# Patient Record
Sex: Male | Born: 1955 | Race: White | Hispanic: No | Marital: Married
Health system: Southern US, Community
[De-identification: ages and names within clinical notes are randomized; demographics above are authoritative.]

---

## 2021-04-09 ENCOUNTER — Other Ambulatory Visit: Payer: Self-pay

## 2021-04-09 ENCOUNTER — Emergency Department (HOSPITAL_COMMUNITY)
Admission: EM | Admit: 2021-04-09 | Discharge: 2021-04-09 | Disposition: A | Discharge status: Home / Self Care | Payer: Self-pay | Attending: Emergency Medicine | Admitting: Emergency Medicine

## 2021-04-09 DIAGNOSIS — Z5321 Procedure and treatment not carried out due to patient leaving prior to being seen by health care provider: Secondary | ICD-10-CM | POA: Insufficient documentation

## 2021-04-09 DIAGNOSIS — H538 Other visual disturbances: Secondary | ICD-10-CM | POA: Insufficient documentation

## 2021-04-09 NOTE — ED Triage Notes (Signed)
Pt sent by his eye doctor for further evaluation of visual changes and new onset hypertension. Pt stating that he only wants BP medication and no other tests to be done.

## 2021-04-09 NOTE — ED Provider Notes (Signed)
Emergency Medicine Provider Triage Evaluation Note  Clinton Brooks , a 65 y.o. male  was evaluated in triage.  Pt complains of vision changes. He was seen by his ophthlamologist pta and BP was significantly elevated. 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 According to paperwork that pt has at bedside, ophtho was recommended labs and mri brain and orbits. I explained this to pt and he iterated that he was simply here for a prescription for blood pressure medications. He states he cannot afford a w/u. I encouraged him to stay and he is going to call the hospital to estimate the cost of the testing and will return for the remainder if triage if he decides to stay. I advised that if he wants to leave the department his medical condition could deteriorate and lead to permanent disability or death. He understands this risk .  Review of Systems  Positive: N/a Negative: N/a  Physical Exam  BP (!) 180/157 (BP Location: Right Arm)   Pulse 97   Temp 98.7 F (37.1 C)   Resp 17   SpO2 98%  Gen:   Awake, no distress   Resp:  Normal effort  MSK:   Moves extremities without difficulty   Medical Decision Making   Pt ultimately decided to leave AMA. He understands the risks associated with leaving the department   Clinton Brooks 04/09/21 1802    Mancel Bale, MD 04/11/21 1148

## 2021-04-10 ENCOUNTER — Other Ambulatory Visit: Payer: Self-pay

## 2021-04-10 ENCOUNTER — Emergency Department
Admission: EM | Admit: 2021-04-10 | Discharge: 2021-04-10 | Disposition: A | Discharge status: Home / Self Care | Payer: Self-pay | Attending: Emergency Medicine | Admitting: Emergency Medicine

## 2021-04-10 ENCOUNTER — Emergency Department: Payer: Self-pay

## 2021-04-10 DIAGNOSIS — Z79899 Other long term (current) drug therapy: Secondary | ICD-10-CM | POA: Insufficient documentation

## 2021-04-10 DIAGNOSIS — I10 Essential (primary) hypertension: Secondary | ICD-10-CM

## 2021-04-10 DIAGNOSIS — G932 Benign intracranial hypertension: Secondary | ICD-10-CM

## 2021-04-10 DIAGNOSIS — R700 Elevated erythrocyte sedimentation rate: Secondary | ICD-10-CM | POA: Insufficient documentation

## 2021-04-10 LAB — CBC
HCT: 45.9 % (ref 39.0–52.0)
Hemoglobin: 15.3 g/dL (ref 13.0–17.0)
MCH: 29.8 pg (ref 26.0–34.0)
MCHC: 33.3 g/dL (ref 30.0–36.0)
MCV: 89.5 fL (ref 80.0–100.0)
Platelets: 277 10*3/uL (ref 150–400)
RBC: 5.13 MIL/uL (ref 4.22–5.81)
RDW: 13.7 % (ref 11.5–15.5)
WBC: 7 10*3/uL (ref 4.0–10.5)
nRBC: 0 % (ref 0.0–0.2)

## 2021-04-10 LAB — BASIC METABOLIC PANEL
Anion gap: 8 (ref 5–15)
BUN: 16 mg/dL (ref 8–23)
CO2: 24 mmol/L (ref 22–32)
Calcium: 8.9 mg/dL (ref 8.9–10.3)
Chloride: 103 mmol/L (ref 98–111)
Creatinine, Ser: 0.93 mg/dL (ref 0.61–1.24)
GFR, Estimated: >60 mL/min (ref >60)
Glucose, Bld: 131 mg/dL — ABNORMAL HIGH (ref 70–99)
Potassium: 3.5 mmol/L (ref 3.5–5.1)
Sodium: 135 mmol/L (ref 135–145)

## 2021-04-10 LAB — TROPONIN I (HIGH SENSITIVITY): Troponin I (High Sensitivity): 8 ng/L (ref <18)

## 2021-04-10 LAB — C-REACTIVE PROTEIN: CRP: 0.6 mg/dL (ref <1.0)

## 2021-04-10 LAB — SEDIMENTATION RATE: Sed Rate: 24 mm/hr — ABNORMAL HIGH (ref 0–20)

## 2021-04-10 IMAGING — MR MR HEAD WO/W CM
13 series · 48 of 48 positions shown · IV contrast (gadavist)
Comparison: None.

CLINICAL DATA: Brain mass or lesion; Vision loss, monocular

EXAM:
MRI HEAD AND ORBITS WITHOUT AND WITH CONTRAST
TECHNIQUE: Multiplanar, multiecho pulse sequences of the brain and surrounding
structures were obtained without and with intravenous contrast.
Multiplanar, multiecho pulse sequences of the orbits and surrounding
structures were obtained including fat saturation techniques, before
and after intravenous contrast administration.
CONTRAST:  10mL GADAVIST GADOBUTROL 1 MMOL/ML IV SOLN

[Series 5: ax dwi_tracew · axial · 3.0mm · 1.80mm/px · z∈[-87,+81]mm · 4 of 52 slices shown]
[im 1/52]
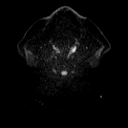
[im 18/52]
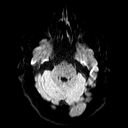
[im 35/52]
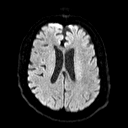
[im 52/52]
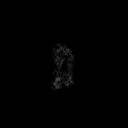

[Series 6: ax dwi_adc · axial · 3.0mm · 1.80mm/px · z∈[-87,+81]mm · 4 of 52 slices shown]
[im 1/52]
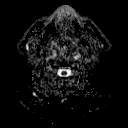
[im 18/52]
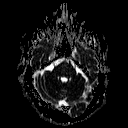
[im 35/52]
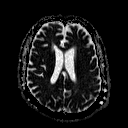
[im 52/52]
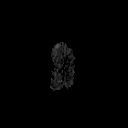

[Series 7: cor dwi_tracew · coronal · 5.0mm · 1.80mm/px · 2 of 42 slices shown]
[im 1/42]
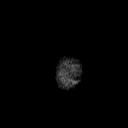
[im 42/42]
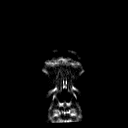

[Series 8: cor dwi_adc · coronal · 5.0mm · 1.80mm/px · 2 of 42 slices shown]
[im 1/42]
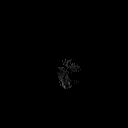
[im 42/42]
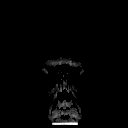

[Series 9: T1 · sagittal · 5.0mm · 0.62mm/px · 1 of 25 slices shown (1 of 2)]
[im 1/25]
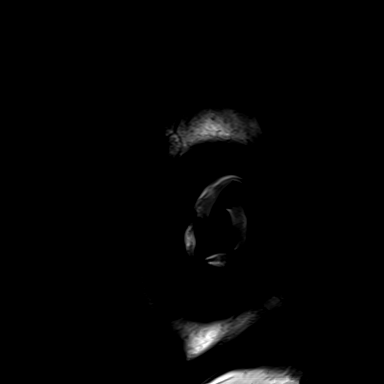

[Series 10: T2 · axial · 5.0mm · 0.53mm/px · z∈[-75,+80]mm · 2 of 27 slices shown (1 of 2)]
[im 1/27]
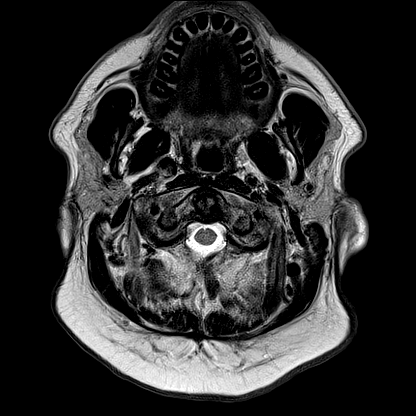
[im 27/27]
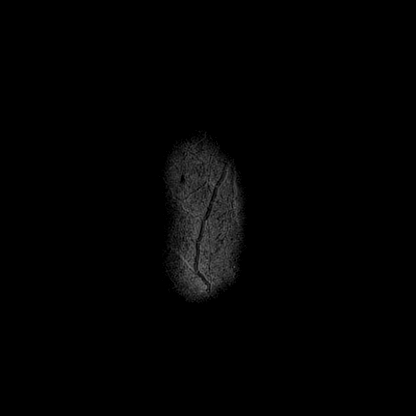

[Series 12: pha_images · axial · 3.0mm · 0.90mm/px · z∈[-80,+82]mm · 3 of 55 slices shown]
[im 1/55]
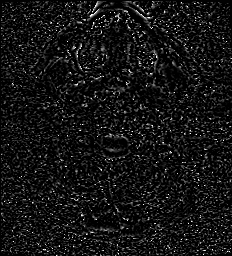
[im 28/55]
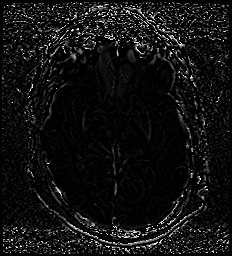
[im 55/55]
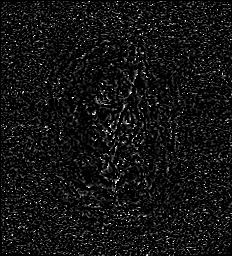

[Series 13: swi_images · axial · 3.0mm · 0.90mm/px · z∈[-80,+85]mm · 3 of 56 slices shown]
[im 1/56]
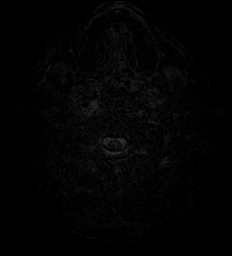
[im 28/56]
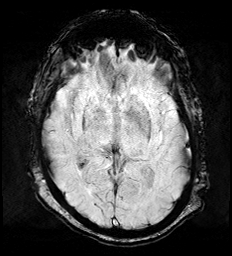
[im 56/56]
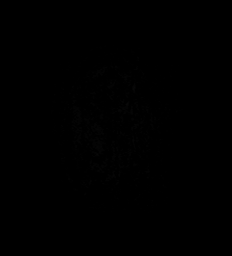

[Series 15: FLAIR · axial · 3.0mm · 0.69mm/px · z∈[-78,+83]mm · 3 of 55 slices shown]
[im 1/55]
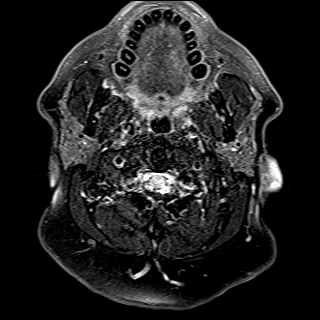
[im 28/55]
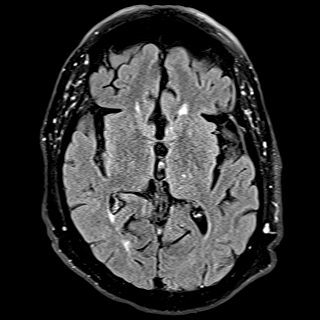
[im 55/55]
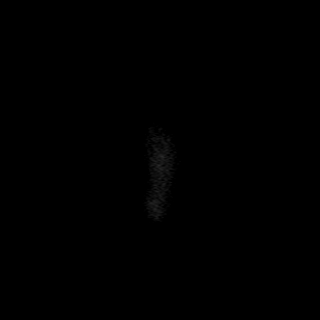

[Series 16: T1 · axial · 1.0mm · 0.98mm/px · z∈[-83,+90]mm · 10 of 175 slices shown (2 of 2)]
[im 1/175]
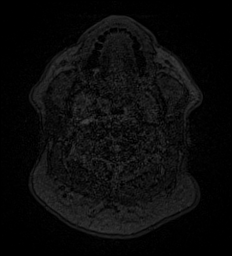
[im 20/175]
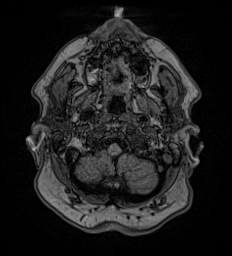
[im 39/175]
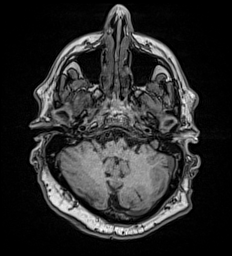
[im 59/175]
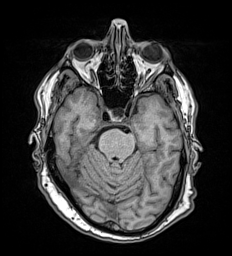
[im 78/175]
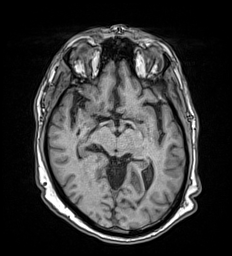
[im 97/175]
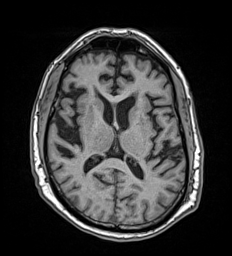
[im 117/175]
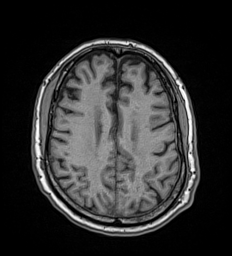
[im 136/175]
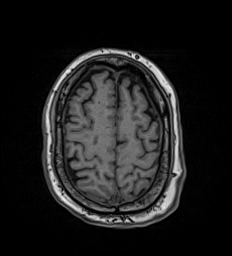
[im 155/175]
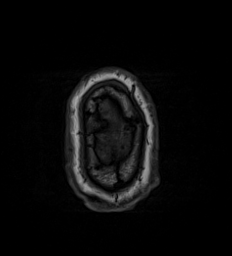
[im 175/175]
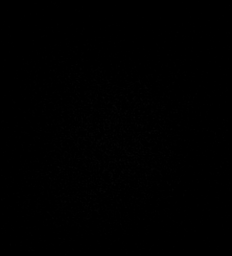

[Series 17: T2 · coronal · 5.0mm · 0.69mm/px · 2 of 33 slices shown (2 of 2)]
[im 1/33]
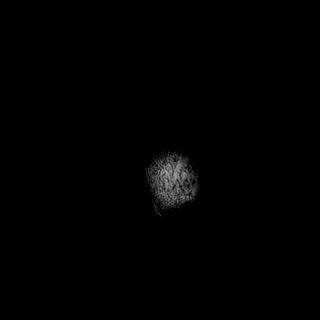
[im 33/33]
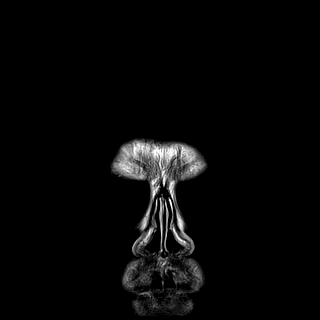

[Series 18: T1 post-contrast · axial · 1.0mm · 0.98mm/px · z∈[-83,+91]mm · 10 of 176 slices shown (1 of 2)]
[im 1/176]
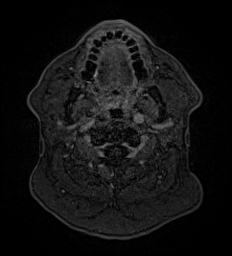
[im 20/176]
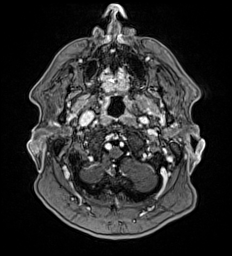
[im 39/176]
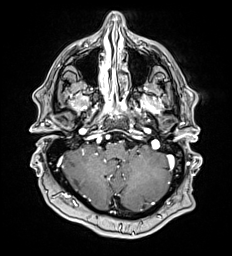
[im 59/176]
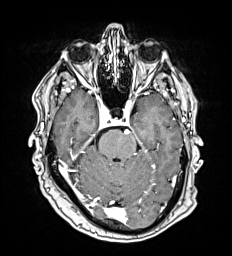
[im 78/176]
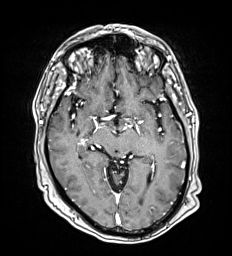
[im 98/176]
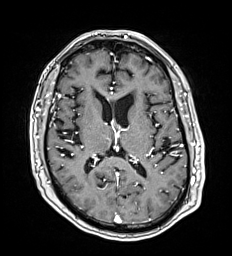
[im 117/176]
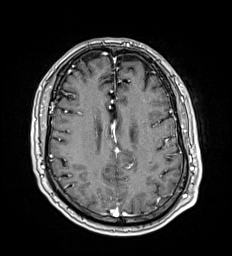
[im 137/176]
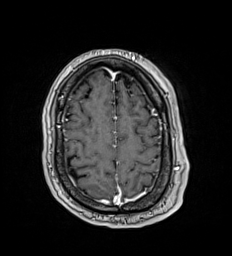
[im 156/176]
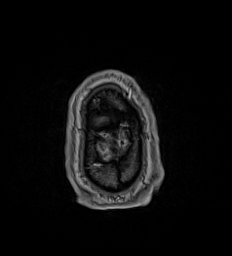
[im 176/176]
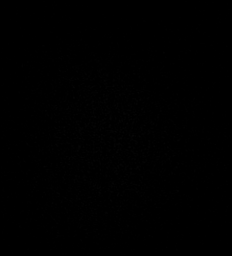

[Series 19: T1 post-contrast · coronal · 5.0mm · 0.57mm/px · 2 of 33 slices shown (2 of 2)]
[im 1/33]
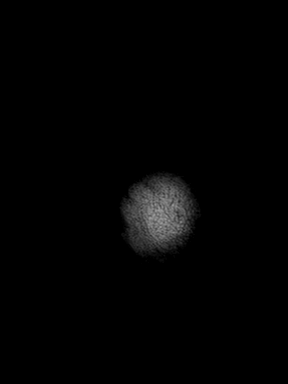
[im 33/33]
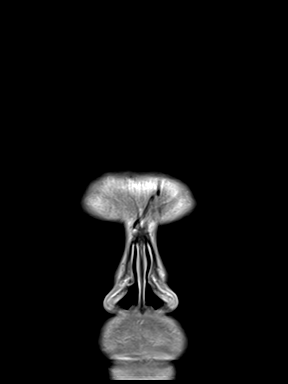

[48 of 48 positions shown; findings below may reference images not displayed]

FINDINGS: MRI HEAD FINDINGS

Brain: No acute infarction, hemorrhage, hydrocephalus, extra-axial
collection or mass lesion. Partially empty sella. Mild scattered T2
hyperintensities in the white matter, nonspecific but compatible
with chronic microvascular ischemic disease. Mildly prominent retro
cerebellar CSF, likely mega cisterna magna versus small arachnoid
cyst. No abnormal enhancement. Mild atrophy with ex vacuo
ventricular dilation

Vascular: Major arterial flow voids are maintained at the skull
base. Narrowing of the right transverse and sigmoid sinuses.

Skull and upper cervical spine: Normal marrow signal.

Other: Trace right mastoid effusion.

MRI ORBITS FINDINGS

Orbits: No traumatic or inflammatory finding. Globes, optic nerves,
orbital fat, extraocular muscles, vascular structures, and lacrimal
glands are normal. No appreciable flattening of the posterior sclera
or protrusion of the optic nerve papilla.

Visualized sinuses: Mild ethmoid air cell mucosal thickening.
Otherwise, clear.

Soft tissues: Negative.
IMPRESSION: MRI head:

1. No evidence of acute intracranial abnormality.
2. Mild chronic microvascular ischemic disease and atrophy.
3. Partially empty sella and narrowed right transverse sinus, which
are often normal anatomic variants but can be associated with
idiopathic intracranial hypertension.

MRI orbits:

No evidence of acute abnormality.  No orbital mass.

## 2021-04-10 IMAGING — MR MR ORBITS WO/W CM
4 of 6 series · 29 of 48 positions shown · IV contrast (10ml Gadavist)
Comparison: None.

CLINICAL DATA: Brain mass or lesion; Vision loss, monocular

EXAM:
MRI HEAD AND ORBITS WITHOUT AND WITH CONTRAST
TECHNIQUE: Multiplanar, multiecho pulse sequences of the brain and surrounding
structures were obtained without and with intravenous contrast.
Multiplanar, multiecho pulse sequences of the orbits and surrounding
structures were obtained including fat saturation techniques, before
and after intravenous contrast administration.
CONTRAST:  10mL GADAVIST GADOBUTROL 1 MMOL/ML IV SOLN

[Series 2: T2 · axial · 3.0mm · 0.78mm/px · z∈[-54,+10]mm · 5 of 17 slices shown (1 of 2)]
[im 1/17]
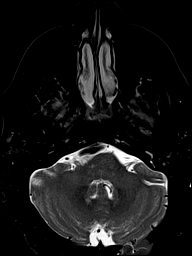
[im 5/17]
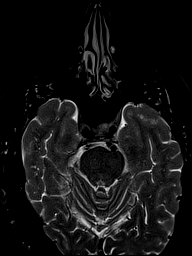
[im 9/17]
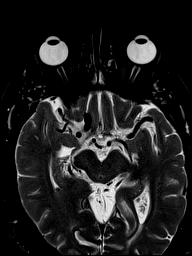
[im 13/17]
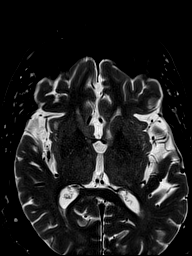
[im 17/17]
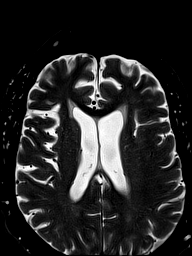

[Series 3: T1 · axial · non-contrast · 3.0mm · 0.31mm/px · z∈[-57,+9]mm · 6 of 23 slices shown (1 of 2)]
[im 1/23]
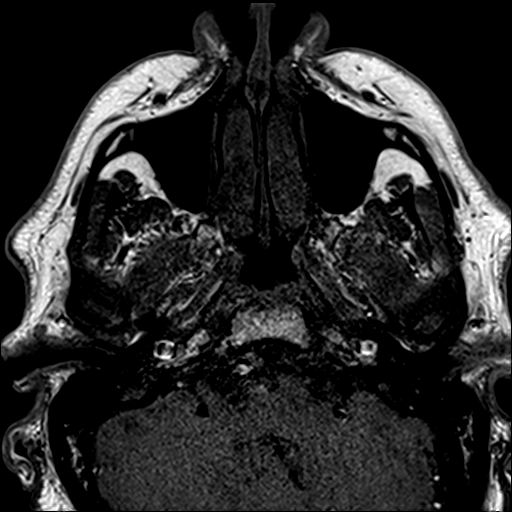
[im 5/23]
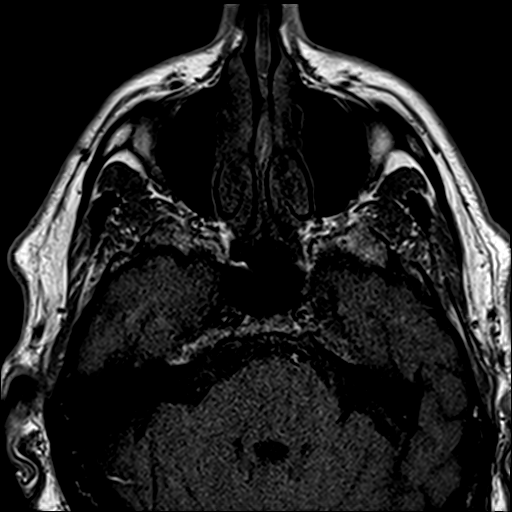
[im 9/23]
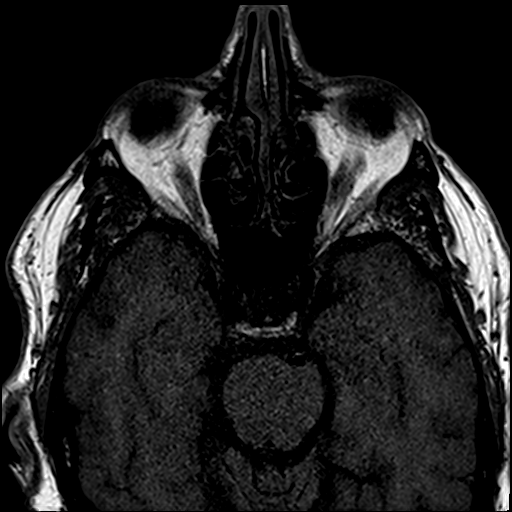
[im 14/23]
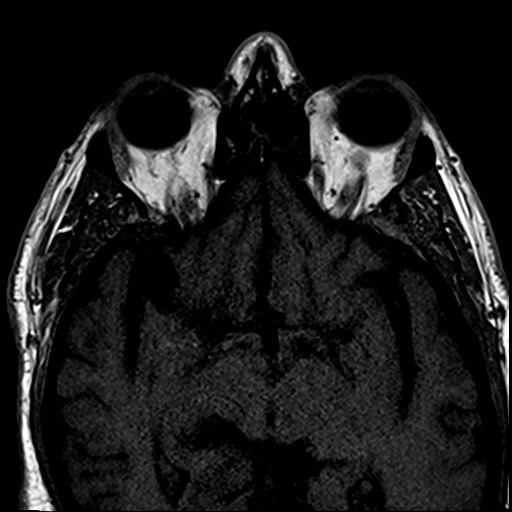
[im 18/23]
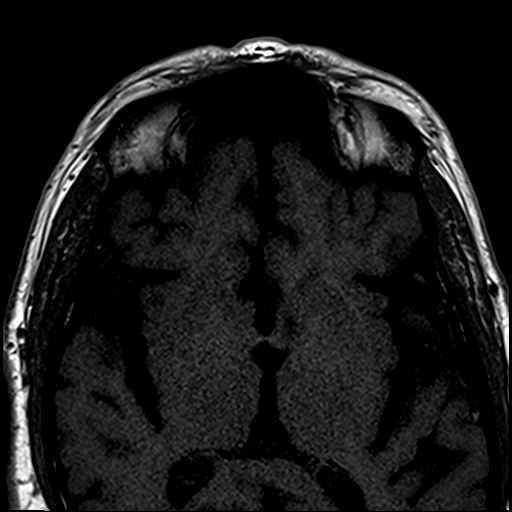
[im 23/23]
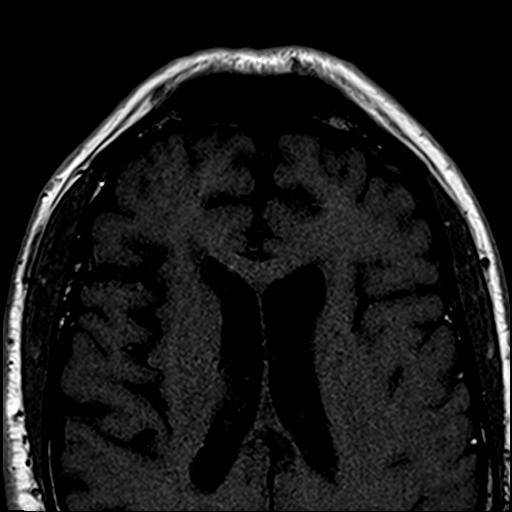

[Series 5: T2 · coronal · 3.0mm · 0.78mm/px · 9 of 35 slices shown (2 of 2)]
[im 1/35]
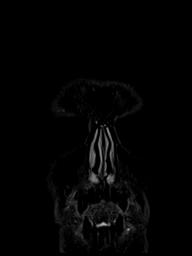
[im 5/35]
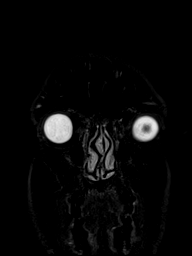
[im 9/35]
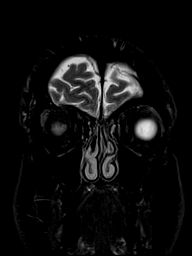
[im 13/35]
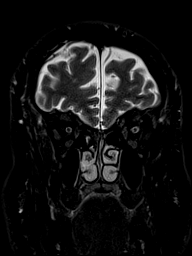
[im 18/35]
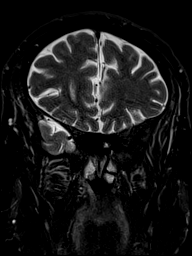
[im 22/35]
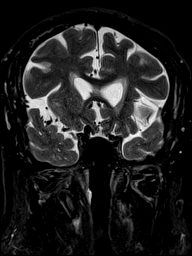
[im 26/35]
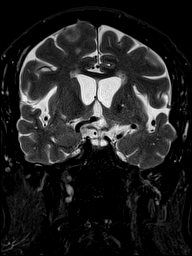
[im 30/35]
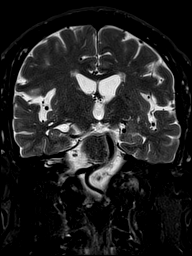
[im 35/35]
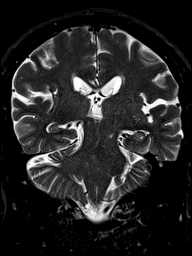

[Series 7: T1 · coronal · non-contrast · 3.0mm · 0.35mm/px · 9 of 40 slices shown (2 of 2)]
[im 1/40]
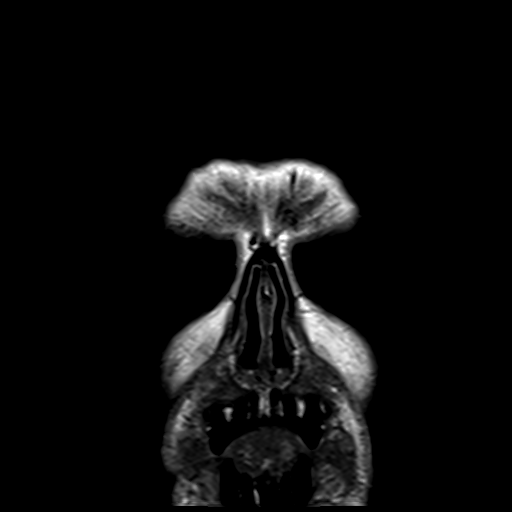
[im 4/40]
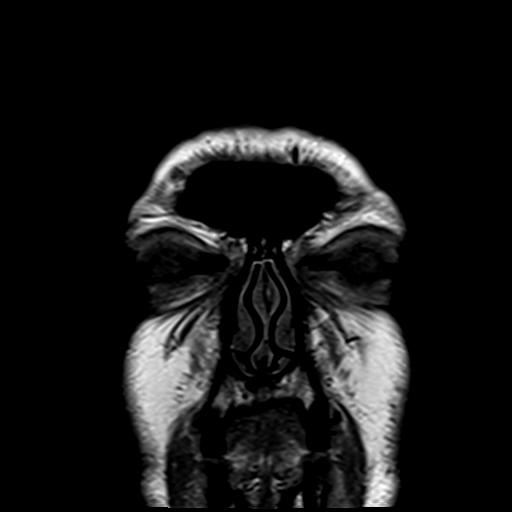
[im 8/40]
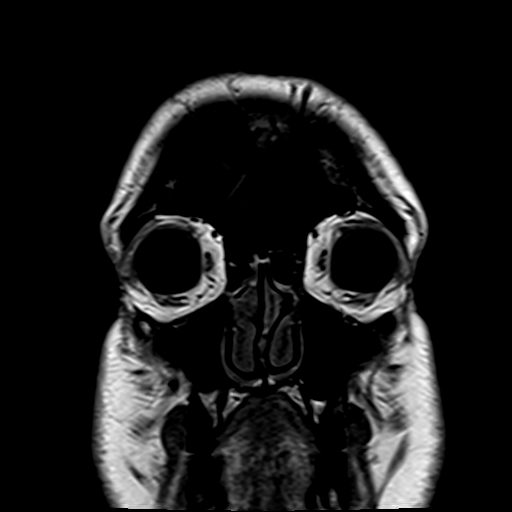
[im 12/40]
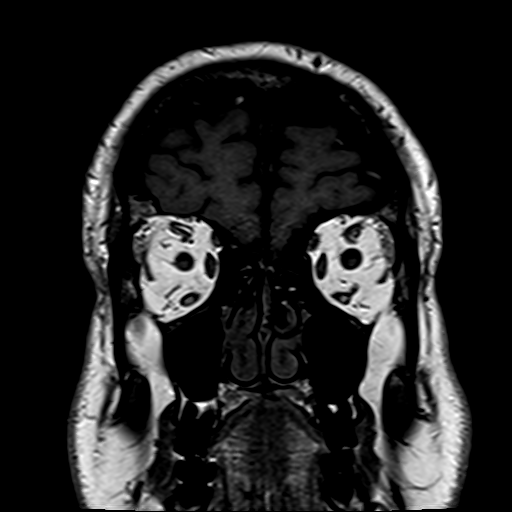
[im 16/40]
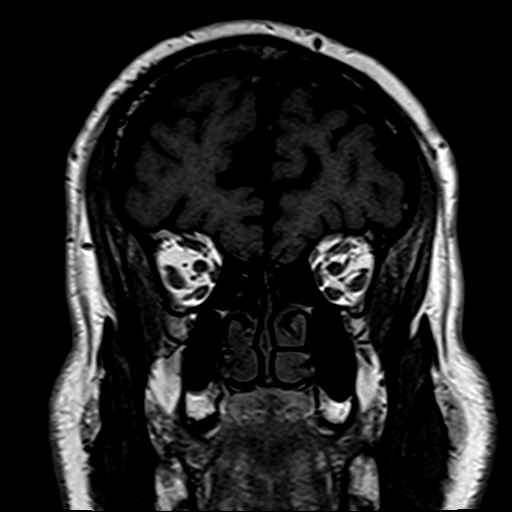
[im 20/40]
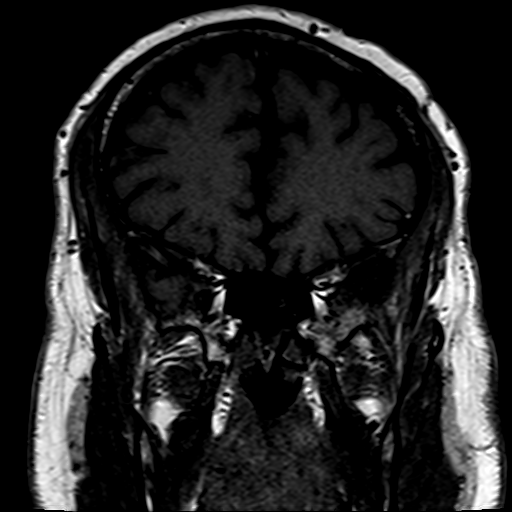
[im 24/40]
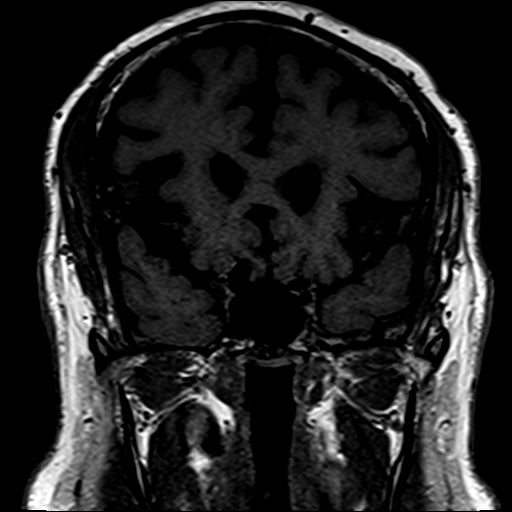
[im 28/40]
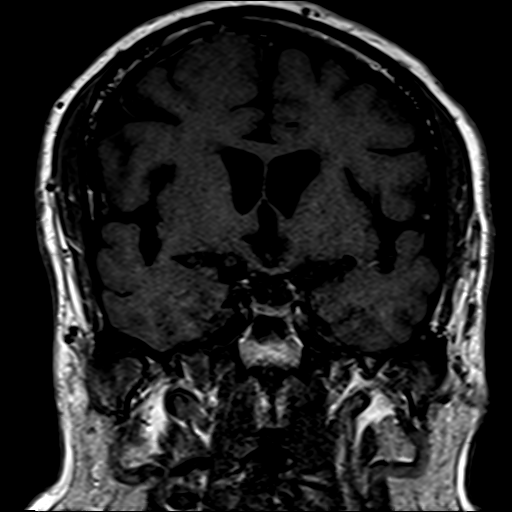
[im 36/40]
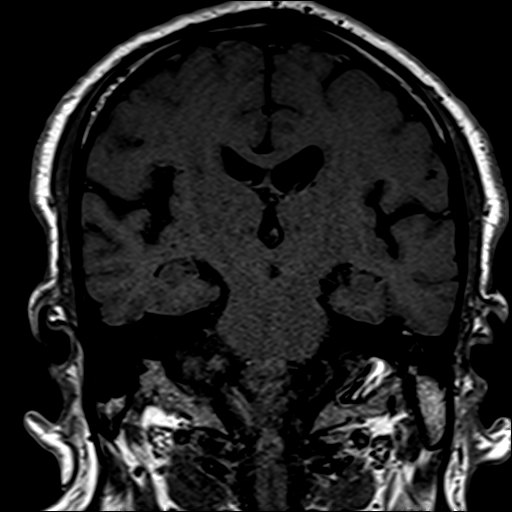

[29 of 48 positions shown; findings below may reference images not displayed]

FINDINGS: MRI HEAD FINDINGS

Brain: No acute infarction, hemorrhage, hydrocephalus, extra-axial
collection or mass lesion. Partially empty sella. Mild scattered T2
hyperintensities in the white matter, nonspecific but compatible
with chronic microvascular ischemic disease. Mildly prominent retro
cerebellar CSF, likely mega cisterna magna versus small arachnoid
cyst. No abnormal enhancement. Mild atrophy with ex vacuo
ventricular dilation

Vascular: Major arterial flow voids are maintained at the skull
base. Narrowing of the right transverse and sigmoid sinuses.

Skull and upper cervical spine: Normal marrow signal.

Other: Trace right mastoid effusion.

MRI ORBITS FINDINGS

Orbits: No traumatic or inflammatory finding. Globes, optic nerves,
orbital fat, extraocular muscles, vascular structures, and lacrimal
glands are normal. No appreciable flattening of the posterior sclera
or protrusion of the optic nerve papilla.

Visualized sinuses: Mild ethmoid air cell mucosal thickening.
Otherwise, clear.

Soft tissues: Negative.
IMPRESSION: MRI head:

1. No evidence of acute intracranial abnormality.
2. Mild chronic microvascular ischemic disease and atrophy.
3. Partially empty sella and narrowed right transverse sinus, which
are often normal anatomic variants but can be associated with
idiopathic intracranial hypertension.

MRI orbits:

No evidence of acute abnormality.  No orbital mass.

## 2021-04-10 MED ORDER — AMLODIPINE BESYLATE 5 MG PO TABS: 5.0000 mg | ORAL_TABLET | Freq: Every day | ORAL | 0 refills | Status: DC

## 2021-04-10 MED ORDER — LABETALOL HCL 5 MG/ML IV SOLN
10.0000 mg | Freq: Once | INTRAVENOUS | Status: AC
  Administered 2021-04-10: 10 mg via INTRAVENOUS
  Filled 2021-04-10: qty 4

## 2021-04-10 MED ORDER — GADOBUTROL 1 MMOL/ML IV SOLN
10.0000 mL | Freq: Once | INTRAVENOUS | Status: AC | PRN
  Administered 2021-04-10: 10 mL via INTRAVENOUS
  Filled 2021-04-10: qty 10

## 2021-04-10 MED ORDER — AMLODIPINE BESYLATE 5 MG PO TABS
5.0000 mg | ORAL_TABLET | Freq: Once | ORAL | Status: AC
  Administered 2021-04-10: 5 mg via ORAL
  Filled 2021-04-10: qty 1

## 2021-04-10 NOTE — ED Triage Notes (Signed)
Pt here with hypertension since yesterday. Pt wemt to [facility] ED to be evaluated and was told that he needed an MRI. Pt was concerned about his bp and wanted to get that down before the MRI, to which the provider did not focus on, according to the pt so he left the facility. BP yesterday was 230/140.

## 2021-04-10 NOTE — ED Provider Notes (Signed)
[facility] Emergency Department Provider Note  ____________________________________________   Event Date/Time   First MD Initiated Contact with Patient 04/10/21 1034     (approximate)  I have reviewed the triage vital signs and the nursing notes.   HISTORY  Chief Complaint Hypertension    HPI Clinton Brooks is a 65 y.o. male here with vision changes.  The patient states that for about the last week, he has had a area of diminished vision in his right visual field.  This is been fairly constant.  He went to optometry yesterday and was sent to a retina specialist.  He was sent to the ED after exam showed papilledema and MRI was ordered.  He is also severely hypertensive at that time.  Denies history of hypertension and states this is fairly new in the last month.  He states that while waiting, he was not given anything for blood pressure which he thought was not reasonable, so he left without being seen.  He did not understand the need for MRI at that time and was also hesitant about additional expenditures.  He does not have insurance.  He states that his vision has remained fairly constant since the onset of diminished vision about a week ago.  He does drive for work.  He has not noticed any severe impairment of his driving since the onset of his vision changes.  No double vision.  No focal numbness or weakness.  No other medical complaints.  Denies history of CVA.    No past medical history on file.  There are no problems to display for this patient.     Prior to Admission medications   Medication Sig Start Date End Date Taking? Authorizing Provider  amLODipine (NORVASC) 5 MG tablet Take 1 tablet (5 mg total) by mouth daily. 04/10/21 06/09/21 Yes Duffy Bruce, MD    Allergies Patient has no known allergies.  No family history on file.  Social History    Review of Systems  Review of Systems  Constitutional:  Negative for chills and fever.   HENT:  Negative for sore throat.   Eyes:  Positive for visual disturbance.  Respiratory:  Negative for shortness of breath.   Cardiovascular:  Negative for chest pain.  Gastrointestinal:  Negative for abdominal pain.  Genitourinary:  Negative for flank pain.  Musculoskeletal:  Negative for neck pain.  Skin:  Negative for rash and wound.  Allergic/Immunologic: Negative for immunocompromised state.  Neurological:  Negative for weakness and numbness.  Hematological:  Does not bruise/bleed easily.  All other systems reviewed and are negative.   ____________________________________________  PHYSICAL EXAM:      VITAL SIGNS: ED Triage Vitals  Enc Vitals Group     BP 04/10/21 0958 (!) 213/119     Pulse Rate 04/10/21 0958 72     Resp 04/10/21 0958 18     Temp 04/10/21 0958 98.5 F (36.9 C)     Temp Source 04/10/21 0958 Oral     SpO2 04/10/21 0958 95 %     Weight 04/10/21 0959 221 lb 4.8 oz (100.4 kg)     Height 04/10/21 0959 5' 7"  (1.702 m)     Head Circumference --      Peak Flow --      Pain Score 04/10/21 0959 0     Pain Loc --      Pain Edu? --      Excl. in [place]? --      Physical  Exam Vitals and nursing note reviewed.  Constitutional:      General: He is not in acute distress.    Appearance: He is well-developed.  HENT:     Head: Normocephalic and atraumatic.  Eyes:     Conjunctiva/sclera: Conjunctivae normal.  Cardiovascular:     Rate and Rhythm: Normal rate and regular rhythm.     Heart sounds: Normal heart sounds.  Pulmonary:     Effort: Pulmonary effort is normal. No respiratory distress.     Breath sounds: No wheezing.  Abdominal:     General: There is no distension.  Musculoskeletal:     Cervical back: Neck supple.  Skin:    General: Skin is warm.     Capillary Refill: Capillary refill takes less than 2 seconds.     Findings: No rash.  Neurological:     Mental Status: He is alert and oriented to person, place, and time.     Motor: No abnormal muscle  tone.     Comments: Neurological Exam:  Mental Status: Alert and oriented to person, place, and time. Attention and concentration normal. Speech clear. Recent memory is intact. Cranial Nerves: Visual fields grossly intact. EOMI and PERRLA. No nystagmus noted. Facial sensation intact at forehead, maxillary cheek, and chin/mandible bilaterally. No facial asymmetry or weakness. Hearing grossly normal. Uvula is midline, and palate elevates symmetrically. Normal SCM and trapezius strength. Tongue midline without fasciculations. Motor: Muscle strength 5/5 in proximal and distal UE and LE bilaterally. No pronator drift. Muscle tone normal. Sensation: Intact to light touch in upper and lower extremities distally bilaterally.  Coordination: Normal FTN bilaterally.         ____________________________________________   LABS (all labs ordered are listed, but only abnormal results are displayed)  Labs Reviewed  BASIC METABOLIC PANEL - Abnormal; Notable for the following components:      Result Value   Glucose, Bld 131 (*)    All other components within normal limits  SEDIMENTATION RATE - Abnormal; Notable for the following components:   Sed Rate 24 (*)    All other components within normal limits  CBC  C-REACTIVE PROTEIN  TROPONIN I (HIGH SENSITIVITY)    ____________________________________________  EKG:  ________________________________________  RADIOLOGY All imaging, including plain films, CT scans, and ultrasounds, independently reviewed by me, and interpretations confirmed via formal radiology reads.  ED MD interpretation:   MR Brain:   Official radiology report(s): MR Brain W and Wo Contrast  Result Date: 04/10/2021 CLINICAL DATA:  Brain mass or lesion; Vision loss, monocular EXAM: MRI HEAD AND ORBITS WITHOUT AND WITH CONTRAST TECHNIQUE: Multiplanar, multiecho pulse sequences of the brain and surrounding structures were obtained without and with intravenous contrast.  Multiplanar, multiecho pulse sequences of the orbits and surrounding structures were obtained including fat saturation techniques, before and after intravenous contrast administration. CONTRAST:  40m GADAVIST GADOBUTROL 1 MMOL/ML IV SOLN COMPARISON:  None. FINDINGS: MRI HEAD FINDINGS Brain: No acute infarction, hemorrhage, hydrocephalus, extra-axial collection or mass lesion. Partially empty sella. Mild scattered T2 hyperintensities in the white matter, nonspecific but compatible with chronic microvascular ischemic disease. 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 No abnormal enhancement. Mild atrophy with ex vacuo ventricular dilation Vascular: Major arterial flow voids are maintained at the skull base. Narrowing of the right transverse and sigmoid sinuses. Skull and upper cervical spine: Normal marrow signal. Other: Trace right mastoid effusion. MRI ORBITS FINDINGS Orbits: No traumatic or inflammatory finding. Globes, optic nerves, orbital fat,  extraocular muscles, vascular structures, and lacrimal glands are normal. No appreciable flattening of the posterior sclera or protrusion of the optic nerve papilla. Visualized sinuses: Mild ethmoid air cell mucosal thickening. Otherwise, clear. Soft tissues: Negative. IMPRESSION: MRI head: 1. No evidence of acute intracranial abnormality. 2. 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 3. Partially empty sella and narrowed right transverse sinus, which are often normal anatomic variants but can be associated with idiopathic intracranial hypertension. MRI orbits: No evidence of acute abnormality.  No orbital mass. Electronically Signed   By: Margaretha Sheffield M.D.   On: 04/10/2021 16:40   MR ORBITS W WO CONTRAST  Result Date: 04/10/2021 CLINICAL DATA:  Brain mass or lesion; Vision loss, monocular EXAM: MRI HEAD AND ORBITS WITHOUT AND WITH CONTRAST TECHNIQUE: Multiplanar, multiecho pulse sequences of the  brain and surrounding structures were obtained without and with intravenous contrast. Multiplanar, multiecho pulse sequences of the orbits and surrounding structures were obtained including fat saturation techniques, before and after intravenous contrast administration. CONTRAST:  64m GADAVIST GADOBUTROL 1 MMOL/ML IV SOLN COMPARISON:  None. FINDINGS: MRI HEAD FINDINGS Brain: No acute infarction, hemorrhage, hydrocephalus, extra-axial collection or mass lesion. Partially empty sella. Mild scattered T2 hyperintensities in the white matter, nonspecific but compatible with chronic microvascular ischemic disease. 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 No abnormal enhancement. Mild atrophy with ex vacuo ventricular dilation Vascular: Major arterial flow voids are maintained at the skull base. Narrowing of the right transverse and sigmoid sinuses. Skull and upper cervical spine: Normal marrow signal. Other: Trace right mastoid effusion. MRI ORBITS FINDINGS Orbits: No traumatic or inflammatory finding. Globes, optic nerves, orbital fat, extraocular muscles, vascular structures, and lacrimal glands are normal. No appreciable flattening of the posterior sclera or protrusion of the optic nerve papilla. Visualized sinuses: Mild ethmoid air cell mucosal thickening. Otherwise, clear. Soft tissues: Negative. IMPRESSION: MRI head: 1. No evidence of acute intracranial abnormality. 2. 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 3. Partially empty sella and narrowed right transverse sinus, which are often normal anatomic variants but can be associated with idiopathic intracranial hypertension. MRI orbits: No evidence of acute abnormality.  No orbital mass. Electronically Signed   By: FMargaretha SheffieldM.D.   On: 04/10/2021 16:40    ____________________________________________  PROCEDURES   Procedure(s) performed (including Critical  Care):  Procedures  ____________________________________________  INITIAL IMPRESSION / MDM / [place]eebe/ ED COURSE  As part of my medical decision making, I reviewed the following data within the [place] reviewed and incorporated, Old chart reviewed, Notes from prior ED visits, and [place] Controlled Substance Database       *DDVID PENDRYwas evaluated in Emergency Department on 04/10/2021 for the symptoms described in the history of present illness. He was evaluated in the context of the global COVID-19 pandemic, which necessitated consideration that the patient might be at risk for infection with the SARS-CoV-2 virus that causes COVID-19. Institutional protocols and algorithms that pertain to the evaluation of patients at risk for COVID-19 are in a state of rapid change based on information released by regulatory bodies including the CDC and federal and state organizations. These policies and algorithms were followed during the patient's care in the ED.  Some ED evaluations and interventions may be delayed as a result of limited staffing during the pandemic.*     Medical Decision Making:  65yo M here with visual loss, papilledema noted on ophtho  exam. Pt is well appearing and otherwise neurologically intact on exam, with no focal neuro deficits. He is hypertensive but otherwise asymptomatic from this standpoint. MRI obtained given ddx of CVA, mass/space lesion, neuritis, per ophtho recommendations, reviewed as above. No evidence of acute abnormality noted. Orbits normal. There is a question of partially empty sella which could suggest IIH, but no overt papilledema on MR. Labs reassuring - ESR only slightly elevated, do not suspect GCA. CRP normal. CBC, BMP unremarkable.   Discussed MR with Neurology on call. Pt has no visual changes now and is neurologically intact. No headache. Suspect possible changes related to his systemic hTN but no indication  for urgent intervention at this time. Would recommend treating systemic HTN. Will start on norvasc, d/c with outpt PCP f/u. Pt in agreement with this plan.  ____________________________________________  FINAL CLINICAL IMPRESSION(S) / ED DIAGNOSES  Final diagnoses:  Primary hypertension  Idiopathic intracranial hypertension     MEDICATIONS GIVEN DURING THIS VISIT:  Medications  labetalol (NORMODYNE) injection 10 mg (10 mg Intravenous Given 04/10/21 1145)  gadobutrol (GADAVIST) 1 MMOL/ML injection 10 mL (10 mLs Intravenous Contrast Given 04/10/21 1624)  labetalol (NORMODYNE) injection 10 mg (10 mg Intravenous Given 04/10/21 1724)  amLODipine (NORVASC) tablet 5 mg (5 mg Oral Given 04/10/21 1724)     ED Discharge Orders          Ordered    amLODipine (NORVASC) 5 MG tablet  Daily        04/10/21 1722             Note:  This document was prepared using Dragon voice recognition software and may include unintentional dictation errors.   Duffy Bruce, MD 04/10/21 2133

## 2021-04-10 NOTE — Discharge Instructions (Addendum)
As we discussed, I would recommend starting the blood pressure medication immediately.  Use your wife's cuff to check your blood pressure at least once to twice daily until follow-up with a primary doctor.  I would highly recommend following up with a primary care doctor, ideally within the next 2 weeks.  I have given you 2 clinics that should help with cost for this and arranging follow-up.  You should also follow-up with the ophthalmologist in several weeks as discussed.  You can call his office to notify him of the MRI and MRI results to see if he is comfortable following up with a primary doctor versus returning to his clinic.

## 2024-07-22 ENCOUNTER — Inpatient Hospital Stay

## 2024-07-22 ENCOUNTER — Emergency Department

## 2024-07-22 ENCOUNTER — Inpatient Hospital Stay
Admission: EM | Admit: 2024-07-22 | Discharge: 2024-07-26 | DRG: 871 | Disposition: E | Attending: Student in an Organized Health Care Education/Training Program | Admitting: Student in an Organized Health Care Education/Training Program

## 2024-07-22 ENCOUNTER — Other Ambulatory Visit: Payer: Self-pay

## 2024-07-22 DIAGNOSIS — I110 Hypertensive heart disease with heart failure: Secondary | ICD-10-CM | POA: Diagnosis present

## 2024-07-22 DIAGNOSIS — I509 Heart failure, unspecified: Secondary | ICD-10-CM

## 2024-07-22 DIAGNOSIS — D72829 Elevated white blood cell count, unspecified: Secondary | ICD-10-CM

## 2024-07-22 DIAGNOSIS — E883 Tumor lysis syndrome: Secondary | ICD-10-CM | POA: Diagnosis present

## 2024-07-22 DIAGNOSIS — E875 Hyperkalemia: Secondary | ICD-10-CM | POA: Diagnosis present

## 2024-07-22 DIAGNOSIS — Z862 Personal history of diseases of the blood and blood-forming organs and certain disorders involving the immune mechanism: Secondary | ICD-10-CM

## 2024-07-22 DIAGNOSIS — J9601 Acute respiratory failure with hypoxia: Secondary | ICD-10-CM | POA: Diagnosis present

## 2024-07-22 DIAGNOSIS — A419 Sepsis, unspecified organism: Principal | ICD-10-CM | POA: Diagnosis present

## 2024-07-22 DIAGNOSIS — K7200 Acute and subacute hepatic failure without coma: Secondary | ICD-10-CM

## 2024-07-22 DIAGNOSIS — Z1152 Encounter for screening for COVID-19: Secondary | ICD-10-CM | POA: Diagnosis not present

## 2024-07-22 DIAGNOSIS — Z515 Encounter for palliative care: Secondary | ICD-10-CM | POA: Diagnosis not present

## 2024-07-22 DIAGNOSIS — G929 Unspecified toxic encephalopathy: Secondary | ICD-10-CM | POA: Diagnosis not present

## 2024-07-22 DIAGNOSIS — R6521 Severe sepsis with septic shock: Secondary | ICD-10-CM | POA: Diagnosis present

## 2024-07-22 DIAGNOSIS — R0602 Shortness of breath: Secondary | ICD-10-CM | POA: Diagnosis present

## 2024-07-22 DIAGNOSIS — Z79899 Other long term (current) drug therapy: Secondary | ICD-10-CM | POA: Diagnosis not present

## 2024-07-22 DIAGNOSIS — N179 Acute kidney failure, unspecified: Secondary | ICD-10-CM | POA: Diagnosis present

## 2024-07-22 DIAGNOSIS — E872 Acidosis, unspecified: Secondary | ICD-10-CM | POA: Diagnosis present

## 2024-07-22 DIAGNOSIS — R7401 Elevation of levels of liver transaminase levels: Secondary | ICD-10-CM | POA: Diagnosis present

## 2024-07-22 DIAGNOSIS — Z66 Do not resuscitate: Secondary | ICD-10-CM | POA: Diagnosis not present

## 2024-07-22 DIAGNOSIS — G928 Other toxic encephalopathy: Secondary | ICD-10-CM | POA: Diagnosis present

## 2024-07-22 DIAGNOSIS — D696 Thrombocytopenia, unspecified: Secondary | ICD-10-CM | POA: Diagnosis present

## 2024-07-22 DIAGNOSIS — R0902 Hypoxemia: Principal | ICD-10-CM

## 2024-07-22 DIAGNOSIS — J9691 Respiratory failure, unspecified with hypoxia: Principal | ICD-10-CM | POA: Diagnosis present

## 2024-07-22 LAB — LACTIC ACID, PLASMA
Lactic Acid, Venous: >9 mmol/L (ref 0.5–1.9)
Lactic Acid, Venous: >9 mmol/L (ref 0.5–1.9)

## 2024-07-22 LAB — URINALYSIS, W/ REFLEX TO CULTURE (INFECTION SUSPECTED)
Bilirubin Urine: NEGATIVE
Glucose, UA: NEGATIVE mg/dL
Ketones, ur: NEGATIVE mg/dL
Leukocytes,Ua: NEGATIVE
Nitrite: NEGATIVE
Protein, ur: 100 mg/dL — AB
RBC / HPF: >50 RBC/hpf (ref 0–5)
Specific Gravity, Urine: 1.019 (ref 1.005–1.030)
pH: 5 (ref 5.0–8.0)

## 2024-07-22 LAB — BASIC METABOLIC PANEL WITH GFR
BUN: 72 mg/dL — ABNORMAL HIGH (ref 8–23)
CO2: <7 mmol/L — ABNORMAL LOW (ref 22–32)
Calcium: 9.9 mg/dL (ref 8.9–10.3)
Chloride: 94 mmol/L — ABNORMAL LOW (ref 98–111)
Creatinine, Ser: 4.79 mg/dL — ABNORMAL HIGH (ref 0.61–1.24)
GFR, Estimated: 13 mL/min — ABNORMAL LOW
Glucose, Bld: 113 mg/dL — ABNORMAL HIGH (ref 70–99)
Potassium: 6.8 mmol/L (ref 3.5–5.1)
Sodium: 140 mmol/L (ref 135–145)

## 2024-07-22 LAB — APTT: aPTT: 42 s — ABNORMAL HIGH (ref 24–36)

## 2024-07-22 LAB — DIFFERENTIAL
Abs Immature Granulocytes: 1.86 K/uL — ABNORMAL HIGH (ref 0.00–0.07)
Basophils Absolute: 0.6 K/uL — ABNORMAL HIGH (ref 0.0–0.1)
Basophils Relative: 1 %
Eosinophils Absolute: 0.7 K/uL — ABNORMAL HIGH (ref 0.0–0.5)
Eosinophils Relative: 1 %
Immature Granulocytes: 2 %
Lymphocytes Relative: 63 %
Lymphs Abs: 49.1 K/uL — ABNORMAL HIGH (ref 0.7–4.0)
Monocytes Absolute: 21.5 K/uL — ABNORMAL HIGH (ref 0.1–1.0)
Monocytes Relative: 28 %
Neutro Abs: 4 K/uL (ref 1.7–7.7)
Neutrophils Relative %: 5 %

## 2024-07-22 LAB — BLOOD GAS, ARTERIAL
Acid-base deficit: 21.7 mmol/L — ABNORMAL HIGH (ref 0.0–2.0)
Bicarbonate: 6.4 mmol/L — ABNORMAL LOW (ref 20.0–28.0)
Delivery systems: POSITIVE
Expiratory PAP: 5 cmH2O
FIO2: 100 %
Inspiratory PAP: 10 cmH2O
O2 Saturation: 100 %
Patient temperature: 37
pCO2 arterial: 21 mmHg — ABNORMAL LOW (ref 32–48)
pH, Arterial: 7.09 — CL (ref 7.35–7.45)
pO2, Arterial: 297 mmHg — ABNORMAL HIGH (ref 83–108)

## 2024-07-22 LAB — CBC
HCT: 38.5 % — ABNORMAL LOW (ref 39.0–52.0)
Hemoglobin: 11.4 g/dL — ABNORMAL LOW (ref 13.0–17.0)
MCH: 28.2 pg (ref 26.0–34.0)
MCHC: 29.6 g/dL — ABNORMAL LOW (ref 30.0–36.0)
MCV: 95.3 fL (ref 80.0–100.0)
Platelets: 45 K/uL — ABNORMAL LOW (ref 150–400)
RBC: 4.04 MIL/uL — ABNORMAL LOW (ref 4.22–5.81)
RDW: 15.9 % — ABNORMAL HIGH (ref 11.5–15.5)
WBC: 78.5 K/uL (ref 4.0–10.5)
nRBC: 1.1 % — ABNORMAL HIGH (ref 0.0–0.2)

## 2024-07-22 LAB — BLOOD GAS, VENOUS
Acid-base deficit: 24.7 mmol/L — ABNORMAL HIGH (ref 0.0–2.0)
Bicarbonate: 7.6 mmol/L — ABNORMAL LOW (ref 20.0–28.0)
O2 Saturation: 51.1 %
Patient temperature: 37
pCO2, Ven: 38 mmHg — ABNORMAL LOW (ref 44–60)
pH, Ven: <6.95 — CL (ref 7.25–7.43)
pO2, Ven: 44 mmHg (ref 32–45)

## 2024-07-22 LAB — COMPREHENSIVE METABOLIC PANEL WITH GFR
ALT: 152 U/L — ABNORMAL HIGH (ref 0–44)
AST: 840 U/L — ABNORMAL HIGH (ref 15–41)
Albumin: 3.3 g/dL — ABNORMAL LOW (ref 3.5–5.0)
Alkaline Phosphatase: 671 U/L — ABNORMAL HIGH (ref 38–126)
Anion gap: 42 — ABNORMAL HIGH (ref 5–15)
BUN: 70 mg/dL — ABNORMAL HIGH (ref 8–23)
CO2: 8 mmol/L — ABNORMAL LOW (ref 22–32)
Calcium: 11.5 mg/dL — ABNORMAL HIGH (ref 8.9–10.3)
Chloride: 92 mmol/L — ABNORMAL LOW (ref 98–111)
Creatinine, Ser: 4.88 mg/dL — ABNORMAL HIGH (ref 0.61–1.24)
GFR, Estimated: 12 mL/min — ABNORMAL LOW
Glucose, Bld: 117 mg/dL — ABNORMAL HIGH (ref 70–99)
Potassium: 6.4 mmol/L (ref 3.5–5.1)
Sodium: 141 mmol/L (ref 135–145)
Total Bilirubin: 4.1 mg/dL — ABNORMAL HIGH (ref 0.0–1.2)
Total Protein: 6.4 g/dL — ABNORMAL LOW (ref 6.5–8.1)

## 2024-07-22 LAB — FIBRINOGEN: Fibrinogen: 386 mg/dL (ref 210–475)

## 2024-07-22 LAB — PRO BRAIN NATRIURETIC PEPTIDE: Pro Brain Natriuretic Peptide: 3023 pg/mL — ABNORMAL HIGH

## 2024-07-22 LAB — GLUCOSE, CAPILLARY
Glucose-Capillary: 98 mg/dL (ref 70–99)
Glucose-Capillary: 170 mg/dL — ABNORMAL HIGH (ref 70–99)

## 2024-07-22 LAB — LACTATE DEHYDROGENASE: LDH: >2500 U/L — ABNORMAL HIGH (ref 105–235)

## 2024-07-22 LAB — TECHNOLOGIST SMEAR REVIEW
Plt Morphology: DECREASED
WBC MORPHOLOGY: ABNORMAL

## 2024-07-22 LAB — ABO/RH: ABO/RH(D): O POS

## 2024-07-22 LAB — RESP PANEL BY RT-PCR (RSV, FLU A&B, COVID)  RVPGX2
Influenza A by PCR: NEGATIVE
Influenza B by PCR: NEGATIVE
Resp Syncytial Virus by PCR: NEGATIVE
SARS Coronavirus 2 by RT PCR: NEGATIVE

## 2024-07-22 LAB — MRSA NEXT GEN BY PCR, NASAL: MRSA by PCR Next Gen: NOT DETECTED

## 2024-07-22 LAB — PROTIME-INR
INR: 2.7 — ABNORMAL HIGH (ref 0.8–1.2)
Prothrombin Time: 30.4 s — ABNORMAL HIGH (ref 11.4–15.2)

## 2024-07-22 LAB — TROPONIN T, HIGH SENSITIVITY
Troponin T High Sensitivity: 263 ng/L (ref 0–19)
Troponin T High Sensitivity: 236 ng/L (ref 0–19)
Troponin T High Sensitivity: 176 ng/L (ref 0–19)

## 2024-07-22 LAB — CBG MONITORING, ED: Glucose-Capillary: 98 mg/dL (ref 70–99)

## 2024-07-22 LAB — PATHOLOGIST SMEAR REVIEW

## 2024-07-22 LAB — D-DIMER, QUANTITATIVE: D-Dimer, Quant: 15.29 ug{FEU}/mL — ABNORMAL HIGH (ref 0.00–0.50)

## 2024-07-22 MED ORDER — DOCUSATE SODIUM 50 MG/5ML PO LIQD: 100.0000 mg | Freq: Two times a day (BID) | ORAL | Status: DC

## 2024-07-22 MED ORDER — ROCURONIUM BROMIDE 10 MG/ML (PF) SYRINGE
PREFILLED_SYRINGE | INTRAVENOUS | Status: AC
  Filled 2024-07-22: qty 10

## 2024-07-22 MED ORDER — SODIUM BICARBONATE 8.4 % IV SOLN
INTRAVENOUS | Status: DC
  Filled 2024-07-22: qty 1000
  Filled 2024-07-22: qty 150
  Filled 2024-07-22: qty 1000

## 2024-07-22 MED ORDER — METRONIDAZOLE 500 MG/100ML IV SOLN
500.0000 mg | Freq: Once | INTRAVENOUS | Status: AC
  Administered 2024-07-22: 500 mg via INTRAVENOUS
  Filled 2024-07-22: qty 100

## 2024-07-22 MED ORDER — ALBUTEROL SULFATE (2.5 MG/3ML) 0.083% IN NEBU
10.0000 mg | INHALATION_SOLUTION | Freq: Once | RESPIRATORY_TRACT | Status: AC
  Administered 2024-07-22: 10 mg via RESPIRATORY_TRACT
  Filled 2024-07-22: qty 12

## 2024-07-22 MED ORDER — POLYETHYLENE GLYCOL 3350 17 G PO PACK: 17.0000 g | PACK | Freq: Every day | ORAL | Status: DC

## 2024-07-22 MED ORDER — ETOMIDATE 2 MG/ML IV SOLN
INTRAVENOUS | Status: AC
  Filled 2024-07-22: qty 10

## 2024-07-22 MED ORDER — HYDROCORTISONE SOD SUC (PF) 100 MG IJ SOLR
100.0000 mg | Freq: Two times a day (BID) | INTRAMUSCULAR | Status: DC
  Administered 2024-07-22: 100 mg via INTRAVENOUS
  Filled 2024-07-22: qty 2

## 2024-07-22 MED ORDER — SODIUM CHLORIDE 0.9 % IV SOLN
2.0000 g | Freq: Once | INTRAVENOUS | Status: AC
  Administered 2024-07-22: 2 g via INTRAVENOUS
  Filled 2024-07-22: qty 12.5

## 2024-07-22 MED ORDER — SODIUM BICARBONATE 8.4 % IV SOLN
50.0000 meq | Freq: Once | INTRAVENOUS | Status: AC
  Administered 2024-07-22: 50 meq via INTRAVENOUS
  Filled 2024-07-22: qty 50

## 2024-07-22 MED ORDER — SODIUM BICARBONATE 8.4 % IV SOLN
INTRAVENOUS | Status: AC
  Administered 2024-07-22: 100 meq via INTRAVENOUS
  Filled 2024-07-22: qty 100

## 2024-07-22 MED ORDER — SODIUM CHLORIDE 0.9% IV SOLUTION: Freq: Once | INTRAVENOUS | Status: DC

## 2024-07-22 MED ORDER — SODIUM BICARBONATE 8.4 % IV SOLN: 50.0000 meq | Freq: Once | INTRAVENOUS | Status: AC

## 2024-07-22 MED ORDER — LACTATED RINGERS IV BOLUS
1000.0000 mL | Freq: Once | INTRAVENOUS | Status: AC
  Administered 2024-07-22: 1000 mL via INTRAVENOUS

## 2024-07-22 MED ORDER — POLYETHYLENE GLYCOL 3350 17 G PO PACK: 17.0000 g | PACK | Freq: Every day | ORAL | Status: DC | PRN

## 2024-07-22 MED ORDER — PROPOFOL 1000 MG/100ML IV EMUL
INTRAVENOUS | Status: AC
  Filled 2024-07-22: qty 100

## 2024-07-22 MED ORDER — ETOMIDATE 2 MG/ML IV SOLN: 10.0000 mg | Freq: Once | INTRAVENOUS | Status: AC

## 2024-07-22 MED ORDER — ORAL CARE MOUTH RINSE: 15.0000 mL | OROMUCOSAL | Status: DC | PRN

## 2024-07-22 MED ORDER — ETOMIDATE 2 MG/ML IV SOLN
INTRAVENOUS | Status: AC
  Administered 2024-07-22: 10 mg via INTRAVENOUS
  Filled 2024-07-22: qty 10

## 2024-07-22 MED ORDER — SODIUM CHLORIDE 0.9 % IV BOLUS
1000.0000 mL | Freq: Once | INTRAVENOUS | Status: AC
  Administered 2024-07-22: 1000 mL via INTRAVENOUS

## 2024-07-22 MED ORDER — SODIUM CHLORIDE 0.9 % IV SOLN: 2.0000 g | INTRAVENOUS | Status: DC

## 2024-07-22 MED ORDER — PROPOFOL 1000 MG/100ML IV EMUL: 0.0000 ug/kg/min | INTRAVENOUS | Status: DC

## 2024-07-22 MED ORDER — NOREPINEPHRINE 16 MG/250ML-% IV SOLN
0.0000 ug/min | INTRAVENOUS | Status: DC
  Administered 2024-07-22: 40 ug/min via INTRAVENOUS
  Filled 2024-07-22: qty 250

## 2024-07-22 MED ORDER — ROCURONIUM BROMIDE 10 MG/ML (PF) SYRINGE
PREFILLED_SYRINGE | INTRAVENOUS | Status: AC
  Administered 2024-07-22: 70 mg via INTRAVENOUS
  Filled 2024-07-22: qty 10

## 2024-07-22 MED ORDER — VANCOMYCIN HCL IN DEXTROSE 1-5 GM/200ML-% IV SOLN: 1000.0000 mg | Freq: Once | INTRAVENOUS | Status: DC

## 2024-07-22 MED ORDER — CALCIUM GLUCONATE-NACL 1-0.675 GM/50ML-% IV SOLN
1.0000 g | Freq: Once | INTRAVENOUS | Status: AC
  Administered 2024-07-22: 1000 mg via INTRAVENOUS
  Filled 2024-07-22: qty 50

## 2024-07-22 MED ORDER — INSULIN ASPART 100 UNIT/ML IV SOLN
10.0000 [IU] | Freq: Once | INTRAVENOUS | Status: AC
  Administered 2024-07-22: 10 [IU] via INTRAVENOUS
  Filled 2024-07-22: qty 10

## 2024-07-22 MED ORDER — DEXTROSE 50 % IV SOLN
1.0000 | Freq: Once | INTRAVENOUS | Status: AC
  Administered 2024-07-22: 50 mL via INTRAVENOUS
  Filled 2024-07-22: qty 50

## 2024-07-22 MED ORDER — SODIUM BICARBONATE 8.4 % IV SOLN
INTRAVENOUS | Status: AC
  Administered 2024-07-22: 50 meq via INTRAVENOUS
  Filled 2024-07-22: qty 50

## 2024-07-22 MED ORDER — VANCOMYCIN HCL 2000 MG/400ML IV SOLN
2000.0000 mg | Freq: Once | INTRAVENOUS | Status: DC
  Filled 2024-07-22: qty 400

## 2024-07-22 MED ORDER — LACTATED RINGERS IV BOLUS (SEPSIS)
1000.0000 mL | Freq: Once | INTRAVENOUS | Status: AC
  Administered 2024-07-22: 1000 mL via INTRAVENOUS

## 2024-07-22 MED ORDER — FENTANYL CITRATE (PF) 50 MCG/ML IJ SOSY: 50.0000 ug | PREFILLED_SYRINGE | INTRAMUSCULAR | Status: DC | PRN

## 2024-07-22 MED ORDER — ROCURONIUM BROMIDE 10 MG/ML (PF) SYRINGE: 100.0000 mg | PREFILLED_SYRINGE | Freq: Once | INTRAVENOUS | Status: AC

## 2024-07-22 MED ORDER — CHLORHEXIDINE GLUCONATE CLOTH 2 % EX PADS
6.0000 | MEDICATED_PAD | Freq: Every day | CUTANEOUS | Status: DC
  Administered 2024-07-22: 6 via TOPICAL
  Filled 2024-07-22: qty 6

## 2024-07-22 MED ORDER — HEPARIN SODIUM (PORCINE) 5000 UNIT/ML IJ SOLN: 5000.0000 [IU] | Freq: Three times a day (TID) | INTRAMUSCULAR | Status: DC

## 2024-07-22 MED ORDER — EPINEPHRINE HCL 5 MG/250ML IV SOLN IN NS
0.5000 ug/min | INTRAVENOUS | Status: DC
  Administered 2024-07-22: 0.5 ug/min via INTRAVENOUS
  Filled 2024-07-22: qty 250

## 2024-07-22 MED ORDER — SODIUM BICARBONATE 8.4 % IV SOLN: 100.0000 meq | Freq: Once | INTRAVENOUS | Status: AC

## 2024-07-22 MED ORDER — VASOPRESSIN 20 UNITS/100 ML INFUSION FOR SHOCK
0.0000 [IU]/min | INTRAVENOUS | Status: DC
  Administered 2024-07-22: 0.03 [IU]/min via INTRAVENOUS
  Filled 2024-07-22: qty 100

## 2024-07-22 MED ORDER — ORAL CARE MOUTH RINSE: 15.0000 mL | OROMUCOSAL | Status: DC

## 2024-07-22 MED ORDER — PANTOPRAZOLE SODIUM 40 MG IV SOLR: 40.0000 mg | Freq: Every day | INTRAVENOUS | Status: DC

## 2024-07-22 MED ORDER — DOCUSATE SODIUM 100 MG PO CAPS: 100.0000 mg | ORAL_CAPSULE | Freq: Two times a day (BID) | ORAL | Status: DC | PRN

## 2024-07-22 MED ORDER — NOREPINEPHRINE 4 MG/250ML-% IV SOLN
INTRAVENOUS | Status: AC
  Administered 2024-07-22: 8 ug/min
  Filled 2024-07-22: qty 250

## 2024-07-22 MED ORDER — INSULIN ASPART 100 UNIT/ML IV SOLN
5.0000 [IU] | Freq: Once | INTRAVENOUS | Status: AC
  Administered 2024-07-22: 5 [IU] via INTRAVENOUS
  Filled 2024-07-22: qty 5

## 2024-07-22 MED ORDER — VANCOMYCIN VARIABLE DOSE PER UNSTABLE RENAL FUNCTION (PHARMACIST DOSING): Status: DC

## 2024-07-22 NOTE — ED Notes (Signed)
 Date and time results received: 2024/08/13 1604 Test: Troponin Critical Value: 263  Name of Provider Notified: MD [facility]

## 2024-07-22 NOTE — ED Notes (Signed)
 Jon, RN attempted to draw second set of blood cultures on patient but was unsuccessful. First set sent to the lab. Lab called to obtain second set.

## 2024-07-22 NOTE — ED Notes (Signed)
 Date and time results received: 2024/08/20 1538  Test: pH Critical Value: 6.95  Name of Provider Notified: MD [facility]

## 2024-07-22 NOTE — ED Provider Notes (Signed)
 "  [facility] Provider Note    Event Date/Time   First MD Initiated Contact with Patient 08/21/24 1507     (approximate)  History   Chief Complaint: Respiratory Distress and Hypertension  HPI  Clinton Brooks is a 68 y.o. male  presents to the ED for SOB.  According to EMS they were having trouble getting BPs did manual bp and it was around 200systolic.  Pt satting in the 80s on RA placed on NRB en route to ED.  Here pt transitioned to bipap on arrival.  Pt seems somewhat confused/altered, unable to give a good hx.  Pt does appear sob.  No sign LE edema.  Pt had ntg paste prior to arrival, BPs now appear low, 60s- 80s systolic with multiple rechecks.  NTG removed.      Physical Exam   Triage Vital Signs: ED Triage Vitals [2024-08-21 1502]  Encounter Vitals Group     BP      Girls Systolic BP Percentile      Girls Diastolic BP Percentile      Boys Systolic BP Percentile      Boys Diastolic BP Percentile      Pulse      Resp      Temp      Temp src      SpO2 (!) 85 %     Weight      Height      Head Circumference      Peak Flow      Pain Score      Pain Loc      Pain Education      Exclude from Growth Chart     Most recent vital signs: Vitals:   08-21-24 1502  SpO2: (!) 85%    General: awake, attempts to answer questions, difficult to hear on bipap, does not appear to be answering my questions. CV:  Good peripheral perfusion.  Regular rate and rhythm  Resp:  Normal effort.  mild wheeze b/l, no obvious rales/rhonchi Abd:  No distention.  Soft, nontender.  No rebound or guarding. Other:  no sign LE edema   ED Results / Procedures / Treatments   EKG  EKG viewed by myself shows NSR at 97bpm, narrow qrs, norm axis, no ST changes  RADIOLOGY  I reviewed CXR, low volume, ?insterstitial changes    MEDICATIONS ORDERED IN ED: Medications  sodium chloride  0.9 % bolus 1,000 mL (has no administration in time range)     IMPRESSION / MDM  / ASSESSMENT AND PLAN / ED COURSE  I reviewed the triage vital signs and the nursing notes.  Patient's presentation is most consistent with acute presentation with potential threat to life or bodily function.  Pt with SOB, satting 85% ra, with no reported home o2 need.  Pt seems confused, will ramble on when asked a question, but doesn't appear to be answering the question, but diffiult to hear on bipap.  We'll get a VBG to eval PCO2.  We will start 1L NS bolus given hypotension until we can see CXR images at least.  NTG has been removed.  No sign edema, no sign ST changes.  Pt has the appearance of COPD with wheeze, but no hx in chart or care everywhere.  We will check broad labs including cbc, cmp, trop, bnp, cxr, vbg, covid/flu. Will keep on tele and reassess once pcxr is done.  VBG shows ph <6.95.  Will dose bicarb and place on  drip.  CBG 95, less likely DKA, suspect lactic acidosis, will start broad spectrum ABX, send lactate and bcx's.  Pt has petechial like rash fairly diffusely, possibly concerning for bacteremia, but no fever.  BP much improved after ntg removed, 137 systolic currently.  Will cont IVF's, cxr shows interstitial changes, but benefit >risk for IVF's given significant acidemia.  Lactic >9. Adding a 3rd liter LR to pts orders.  Will req ICU level admission.  Pt mentation improving.  States no prescription medications, non smoker.    Wife now here states HTN/HLD as only hx.  I spoke with ICU given sign lab abnorms. They have seen pt will intubate in ED and admit to ICU.  Labs show ph<6.95, wbc 78.5, plt 45. K 6.4 (rec'ing bicarb and fluids).  Cr 4.8 (aki), AG 42.  Trop 263 (likely demand vs myocarditis).  BNP 3023, but again benefit of IVFs outweighs risk.  Lactic >9 (rec'ing IVF 30ml/kg) in addition to broad ABX.    CRITICAL CARE Performed by: Franky Moores   Total critical care time: 45 minutes  Critical care time was exclusive of separately billable procedures and  treating other patients.  Critical care was necessary to treat or prevent imminent or life-threatening deterioration.  Critical care was time spent personally by me on the following activities: development of treatment plan with patient and/or surrogate as well as nursing, discussions with consultants, evaluation of patient's response to treatment, examination of patient, obtaining history from patient or surrogate, ordering and performing treatments and interventions, ordering and review of laboratory studies, ordering and review of radiographic studies, pulse oximetry and re-evaluation of patient's condition.    FINAL CLINICAL IMPRESSION(S) / ED DIAGNOSES   SOB Septic shock   Note:  This document was prepared using Dragon voice recognition software and may include unintentional dictation errors.   Moores Franky, MD 2024-08-09 1640  "

## 2024-07-22 NOTE — Sepsis Progress Note (Signed)
 Sepsis protocol is being followed by eLink.

## 2024-07-22 NOTE — H&P (Addendum)
 "  NAME:  Clinton Brooks, MRN:  [id number], DOB:  06-01-1956, LOS: 0 ADMISSION DATE:  08-21-2024, CONSULTATION DATE:  08-21-2024 REFERRING MD:  Franky Moores, MD, CHIEF COMPLAINT:  Septic Shock   History of Present Illness:  Patient is a 68 year old male presenting to the hospital with a few days of increased shortness of breath and overall fatigue/malaise.  Patient was disoriented and in respiratory distress on BiPAP at the time of my evaluation.  History was obtained through his wife and daughter.  Wife reports that the patient has been feeling unwell for the past few days, since at least Wednesday.  He has had increased shortness of breath as well as some chest tightness.  Wife denies any notes of fevers, chills, or night sweats.  She does report that he has lost some weight.  There appears to be some confusion regarding the patient's blood pressure.  Per EMS report, he had a blood pressure in the 200s.  He was started on a nitro paste.  On arrival to the ED, he was quite hypotensive with systolics in the 60s.  Blood work drawn and initial CBC showed a total white count of 78,000.  CMP showed multiple disturbances with hyperkalemia of 6.4, uremia of 70, AKI with creatinine of 4.88, hypercalcemia of 11.5, and elevated liver function tests.  proBNP was 3000.  Lactic acid was greater than 9.  Patient was initiated on a fluid bolus and broad-spectrum antibiotics administered.  PCCM was consulted for further evaluation.  Patient brought up emergently to the ICU where an arterial line and a central line were placed.  He is being considered for intubation and mechanical ventilation.  Pertinent  Medical History  Hypertension on amlodipine   Significant Hospital Events: Including procedures, antibiotic start and stop dates in addition to other pertinent events   12/28: Presenting to the ED, admit   Objective    Blood pressure 105/81, pulse 94, temperature 98 F (36.7 C), temperature source  Axillary, resp. rate (!) 40, height 5' 10 (1.778 m), weight 103.4 kg, SpO2 100%.    FiO2 (%):  [100 %] 100 % PEEP:  [5 cmH20] 5 cmH20 Pressure Support:  [10 cmH20] 10 cmH20  No intake or output data in the 24 hours ending 21-Aug-2024 1722 Filed Weights   21-Aug-2024 1535  Weight: 103.4 kg    Examination:  Physical Exam Constitutional:      General: He is in acute distress.     Appearance: He is obese. He is ill-appearing.  Cardiovascular:     Rate and Rhythm: Regular rhythm. Tachycardia present.     Pulses: Normal pulses.     Heart sounds: Normal heart sounds.  Pulmonary:     Effort: Pulmonary effort is normal.     Breath sounds: Normal breath sounds.  Musculoskeletal:     Right lower leg: No edema.     Left lower leg: No edema.  Skin:    Findings: Rash (diffuse petechial rash) present.  Neurological:     Mental Status: He is disoriented.      Assessment and Plan   68 year old male presenting with shock, respiratory failure, kidney injury, hyperkalemia, and severe leukocytosis suspicious of septic shock versus TLS versus DIC.  Neurology #Toxic Metabolic Encephalopathy  Encephalopathy in the setting of septic shock, which has slowly improved over the course of the last couple of hours with resuscitation and antibiotics.  We are considering intubation and mechanical ventilation and would prefer propofol  for sedation should that be the case.  Cardiovascular #Septic shock #Decompensated Heart Failure  His hypotension has not responded to volume resuscitation (total 4 L) and his lactic acid is greater than 9. Septic shock is highest on my differential though other considerations include cardiogenic or obstructive shock (proBNP is significantly elevated).  Will send for CT scan with PE protocol once he is stable.  He is on norepinephrine  and vasopressin , with goal MAP greater than 65.  Will continue to trend lactic acid and obtain a TTE in the morning. - Trend lactic acid -  Trend troponin - Nor epi and vasopressin , goal MAP greater than 65  -epinephrine  and methylene blue if remains hypotensive - TTE in a.m.  Pulmonary #Acute Hypoxic Respiratory Failure  Acute hypoxic respiratory failure in the setting of severe leukocytosis as well as shock.  Differential diagnosis includes pulmonary edema, pneumonia, pulmonary embolism, and leukostasis.  He also has severe metabolic acidosis currently on BiPAP with some improvement in his pH but continues to be acidemic.  Patient will benefit from early intubation should his acidosis not quickly improve or he shows signs of tiring out.  - Continue BiPAP - Trend ABG - Will consider early intubation  Gastrointestinal #Liver Injury  #Transaminitis  Suspect elevated liver function tests is due to shock though other etiologies could include Budd-Chiari or portal vein thrombosis.  Will obtain an ultrasound once stable.  Will also obtain a CT scan of the abdomen.  N.p.o. for now, start sup.  Renal #Acute kidney injury #HyperK  #HyperCa #Suspected TLS #Acute Renal Failure  #High anion gap metabolic acidosis  Presents with severe hyperkalemia, hypercalcemia, and acute kidney injury.  He has severe metabolic acidosis secondary to lactic acidosis and renal failure.  The combination of this with leukocytosis suggests either septic shock with DIC or tumor lysis syndrome secondary to leukemia/lymphoma with a blast crisis.  Initiated on sodium bicarbonate  infusion for his severe acidosis and we will consult with nephrology for initiation of CRRT.  -Continue sodium bicarbonate  infusion -Check LDH, uric acid -Consider early CRRT -Consider rasburicase if TLS confirmed, check G6PD  Endocrine - ICU glycemic protocol  Hem/Onc #Severe leukocytosis  #possible leukemia/lymphoma   #possible blast crisis #Suspected DIC #Thrombocytopenia  Severe leukocytosis with white cell count of 78,000.  This white count is either secondary to  septic shock from bacteremia or secondary to a lymphoma/leukemia with a blast crisis.  Will send for differential and smear evaluation to assess for schistocytes given thrombocytopenia.  Differential diagnosis includes DIC, blast crisis, TLS. - Check INR, PTT - Check fibrinogen , D-dimer -  Will obtain a pan scan  ID #Septic Shock  Septic shock is on the differential for his presentation.  Blood cultures and urine cultures were drawn and he is initiated on broad-spectrum antibiotics with cefepime  and vancomycin .  Will obtain a CT scan of the chest/abdomen/pelvis to further investigate the cause of his presentation.   -check blood and urine cultures  -check urine legionella/strep antigens  -vanc/cefepime   Labs   CBC: Recent Labs  Lab July 24, 2024 1520  WBC 78.5*  HGB 11.4*  HCT 38.5*  MCV 95.3  PLT 45*    Basic Metabolic Panel: Recent Labs  Lab 24-Jul-2024 1520  NA 141  K 6.4*  CL 92*  CO2 8*  GLUCOSE 117*  BUN 70*  CREATININE 4.88*  CALCIUM  11.5*   GFR: Estimated Creatinine Clearance: 17.9 mL/min (A) (by C-G formula based on SCr of 4.88 mg/dL (H)). Recent Labs  Lab 24-Jul-2024 1520  WBC 78.5*  LATICACIDVEN >9.0*  Liver Function Tests: Recent Labs  Lab 08-01-24 1520  AST 840*  ALT 152*  ALKPHOS 671*  BILITOT 4.1*  PROT 6.4*  ALBUMIN 3.3*   No results for input(s): LIPASE, AMYLASE in the last 168 hours. No results for input(s): AMMONIA in the last 168 hours.  ABG    Component Value Date/Time   HCO3 7.6 (L) 08-01-2024 1520   ACIDBASEDEF 24.7 (H) 08-01-2024 1520   O2SAT 51.1 01-Aug-2024 1520     Coagulation Profile: No results for input(s): INR, PROTIME in the last 168 hours.  Cardiac Enzymes: No results for input(s): CKTOTAL, CKMB, CKMBINDEX, TROPONINI in the last 168 hours.  HbA1C: No results found for: HGBA1C  CBG: Recent Labs  Lab 2024-08-01 1541 08-01-24 1702  GLUCAP 98 98    Review of Systems:   N/A  Past Medical  History:  He,  has no past medical history on file.   Surgical History:  The histories are not reviewed yet. Please review them in the History navigator section and refresh this SmartLink.   Social History:      Family History:  His family history is not on file.   Allergies Allergies[1]   Home Medications  Prior to Admission medications  Medication Sig Start Date End Date Taking? Authorizing Provider  amLODipine  (NORVASC ) 10 MG tablet Take 10 mg by mouth daily. 06/12/24  Yes [provider]  atorvastatin (LIPITOR) 40 MG tablet Take 40 mg by mouth daily. 06/14/24  Yes [provider]     The patient is critically ill due to Septic Shock, Acute Renal Failure, TLS, DIC, Toxic Metabolic Encephalopathy, Acute Hypoxic Respiratory Failure.  Critical care was necessary to treat or prevent imminent or life-threatening deterioration. I personally performed high risk medication and infusion titration and management, titration, monitoring, and management of vasopressor/ionotrope infusion, non-invasive positive pressure ventilation management and titration, blood gas interpretation, fluid resuscitation (bolus), correction of life threatening electrolyte abnormalities, and administration of IV electrolytes. Critical care time was spent by me on the following activities: development of a treatment plan with the patient and/or surrogate as well as nursing, discussions with consultants, evaluation of the patient's response to treatment, examination of the patient, obtaining a history from the patient or surrogate, ordering and performing treatments and interventions, ordering and review of laboratory studies, ordering and review of radiographic studies, review of telemetry data including pulse oximetry, re-evaluation of patient's condition and participation in multidisciplinary rounds.   I personally spent 125 minutes providing critical care not including any separately billable  procedures.   Belva November, MD [facility] 08/01/2024 6:46 PM        [1] No Known Allergies  "

## 2024-07-22 NOTE — ED Notes (Signed)
 CCMD called to admit patient to monitor

## 2024-07-22 NOTE — Code Documentation (Signed)
 CODE BLUE NOTE  Patient Name: Clinton Brooks   MRN: [id number]   Date of Birth/ Sex: 1955/12/16 , male      Admission Date: 07-27-2024  Attending Provider: Isadora Hose, MD  Primary Diagnosis: Respiratory failure with hypoxia [facility])   Cardiopulmonary Resuscitation Directed by: [name]   I personally directed ancillary staff and/or performed CPR in an effort to regain return of spontaneous circulation.   Indication: Patient admitted to the ICU with Septic Shock, Respiratory Failure, Kidney Injury, Hyperkalemia, Leukocytosis, Suspected Tumor Lysis Syndrome (TLS), Suspected Disseminated Intravascular Coagulation (DIC). He was placed on BiPAP for increased work of breathing but eventually failed BiPAP requiring intubation. Post intubation, his pressor requirement increased maxing out on Levophed  and vasopressin  requiring a third pressor. Soon he lost pulses and CPR/ACLS initiated as below    Technical Description:  -CPR performance duration:   -Was defibrillation or cardioversion used? No   -Was external pacer placed? No  -Was patient intubated pre/post CPR? Yes    Medications Administered: Y = Yes; Blank = No Amiodarone  no  Atropine  no  Calcium   x1  Epinephrine   x3  Lidocaine    Magnesium  x1  Norepinephrine   infusing  Phenylephrine    Sodium bicarbonate   x4  Vasopressin  infusing  D50              We reviewed the following probable cause with the team    H's and T's Consideration: The team reviewed the ACLS H's and T's as potential causes for the patient's cardiac arrest, including: -Hypovolemia  -Hypoxia -Hydrogen Ion excess (acidosis) -Hypoglycemia -Hypokalemia / Hyperkalemia  -Hypothermia  -Tension pneumothorax  -Toxins  -Thrombosis PE / MI   Family Notification: The patient's family was notified and was present at the bedside during the resuscitation process. They were informed of the patient's critical status and ongoing resuscitation  efforts. After discussing the patient's prognosis with the family and the team, the decision was made by family members to change code status to DNR.    Post-Resuscitation Status: Despite extensive measures, the patient ultimately lost pulses again and was not resuscitated per family wishes. Final Rhythm: Bradycardia then PEA Hemodynamics: The patient remained hypotensive with a systolic BP in the 30s despite vasopressor support. Then eventually flat line from art line reading.   Code Termination: The patient was pronounced dead 05-Aug-2117 pm, Family notified and present at the bedside.              [name], DNP, CCRN, FNP-C, AGACNP-BC Acute Care & Family Nurse Practitioner  [facility]  PCCM on call pager [phone] until 7 am

## 2024-07-22 NOTE — ED Notes (Signed)
 Lab unable to collect second set of blood cultures at this time. One total set obtained.

## 2024-07-22 NOTE — Consult Note (Signed)
 CODE SEPSIS - PHARMACY COMMUNICATION  **Broad Spectrum Antibiotics should be administered within 1 hour of Sepsis diagnosis**  Time Code Sepsis Called/Page Received: 1544  Antibiotics Ordered: vancomycin , cefepime , flagyl   Time of 1st antibiotic administration: 1550  Additional action taken by pharmacy: n/a  If necessary, Name of Provider/Nurse Contacted: n/a    Clinton Brooks [name] ,PharmD Clinical Pharmacist  08-09-2024  4:00 PM

## 2024-07-22 NOTE — Procedures (Signed)
 Arterial Catheter Insertion Procedure Note  Clinton Brooks  [id number]  21-Nov-1955  Date:07/26/2024  Time:5:21 PM    Provider Performing: Belva Jehiel Koepp    Procedure: Insertion of Arterial Line (63379) with US  guidance (23062)   Indication(s) Blood pressure monitoring and/or need for frequent ABGs  Consent Unable to obtain consent due to emergent nature of procedure.  Anesthesia None   Time Out Verified patient identification, verified procedure, site/side was marked, verified correct patient position, special equipment/implants available, medications/allergies/relevant history reviewed, required imaging and test results available.   Sterile Technique Maximal sterile technique including full sterile barrier drape, hand hygiene, sterile gown, sterile gloves, mask, hair covering, sterile ultrasound probe cover (if used).   Procedure Description Area of catheter insertion was cleaned with chlorhexidine  and draped in sterile fashion. With real-time ultrasound guidance an arterial catheter was placed into the right radial artery.  Appropriate arterial tracings confirmed on monitor.     Complications/Tolerance None; patient tolerated the procedure well.   EBL Minimal   Specimen(s) None  Belva November, MD [facility] Jul 26, 2024 5:22 PM

## 2024-07-22 NOTE — Procedures (Signed)
 INTUBATION PROCEDURE NOTE  Clinton Brooks  [id number]  01/14/1956  Date:24-Jul-2024  Time:9:58 PM   Provider Performing:Orlena Garmon [name]   Procedure: Intubation (31500)  Indication(s) Respiratory Failure  Consent Unable to obtain consent due to emergent nature of procedure.  Anesthesia Etomidate  and Rocuronium   Time Out Verified patient identification, verified procedure, site/side was marked, verified correct patient position, special equipment/implants available, medications/allergies/relevant history reviewed, required imaging and test results available.  Sterile Technique Usual hand hygeine, masks, and gloves were used  Procedure Description Patient positioned in bed supine.  Sedation given as noted above.  Patient was intubated with endotracheal tube using Glidescope.  View was Grade 4 no glottis structures visible.  Number of attempts was 1.  Colorimetric CO2 detector was consistent with tracheal placement.  Complications/Tolerance None; patient tolerated the procedure well. Chest X-ray is ordered to verify placement.  EBL Minimal  Specimen(s) None     [name] DNP, CCRN, FNP-C, AGACNP-BC Acute Care & Family Nurse Practitioner [facility] PCCM on call pager [phone]

## 2024-07-22 NOTE — Progress Notes (Signed)
" °   2024/08/08 2100  Spiritual Encounters  Type of Visit Initial  Care provided to: [facility] partners present during encounter Physician  Referral source Code page  Reason for visit Code  OnCall Visit Yes   Chaplain responded to Code Blue. Chaplain encountered and provided grief/bereavement services as a result of patient death.  "

## 2024-07-22 NOTE — ED Triage Notes (Signed)
 Patient to ED via [facility] from home. Patient has not been feeling well for almost a week now. Family says patient has been out of his BP meds for 3 days and has had SOB for 2 days. This RN is unable to obtain much Hx on the patient due to the patient being SOB and not able to speak in full sentences.  EMS applied 1.5 inch of nitroglycerin paste which was removed upon arrival to ED. EMS states patient's room air O2 sat was 85% so he was placed on a NRB and sats came up to 99%.

## 2024-07-22 NOTE — ED Notes (Signed)
 Advised nurse thaat patient has ready bed

## 2024-07-22 NOTE — ED Notes (Signed)
 Date and time results received: 07-29-24 1617  Test: Potassium Critical Value: 6.4  Name of Provider Notified: MD [facility]

## 2024-07-22 NOTE — Consult Note (Signed)
 Pharmacy Antibiotic Note  Clinton Brooks is a 68 y.o. male admitted on 08-15-2024 with septic shock, lactic acid >9, WBC 78.5.  Pharmacy has been consulted for vancomycin  and cefepime  dosing.  Scr. 4.88 on admission, unclear BL, scr. From 2022 was 0.93  Plan: Cefepime  2 G q24h Vancomycin  2,000 mg LD scheduled to be given on 12/28 Will monitor and dose vancomycin  per variable dosing protocol given poor renal function in the setting of septic shock  Height: 5' 10 (177.8 cm) Weight: 103.4 kg (228 lb) IBW/kg (Calculated) : 73  Temp (24hrs), Avg:97.9 F (36.6 C), Min:97.7 F (36.5 C), Max:98 F (36.7 C)  Recent Labs  Lab 08/15/24 1520  WBC 78.5*  CREATININE 4.88*  LATICACIDVEN >9.0*    Estimated Creatinine Clearance: 17.9 mL/min (A) (by C-G formula based on SCr of 4.88 mg/dL (H)).    Allergies[1]  Antimicrobials this admission: 12/28 vancomycin  >>  12/28 cefepime  >>  12/28 metronidazole >>   Microbiology results: 12/28 BCx: pending 12/28 UCx: pending  12/28 MRSA PCR: pending   Thank you for allowing pharmacy to be a part of this patients care.  Annabella LOISE Banks, PharmD Clinical Pharmacist 2024/08/15 6:33 PM      [1] No Known Allergies

## 2024-07-22 NOTE — Procedures (Signed)
 Central Venous Catheter Insertion Procedure Note  Clinton Brooks  [id number]  [birth year]  Date:Aug 13, 2024  Time:6:41 PM   Provider Performing:Prajwal Fellner   Procedure: Insertion of Non-tunneled Central Venous Catheter(36556) with US  guidance (23062)   Indication(s) Medication administration and Difficult access  Consent Unable to obtain consent due to emergent nature of procedure.  Anesthesia Topical only with 1% lidocaine   Timeout Verified patient identification, verified procedure, site/side was marked, verified correct patient position, special equipment/implants available, medications/allergies/relevant history reviewed, required imaging and test results available.  Sterile Technique Maximal sterile technique including full sterile barrier drape, hand hygiene, sterile gown, sterile gloves, mask, hair covering, sterile ultrasound probe cover (if used).  Procedure Description Area of catheter insertion was cleaned with chlorhexidine  and draped in sterile fashion.  With real-time ultrasound guidance a central venous catheter was placed into the left internal jugular vein. Nonpulsatile blood flow and easy flushing noted in all ports.  The catheter was sutured in place and sterile dressing applied.  Complications/Tolerance None; patient tolerated the procedure well. Chest X-ray is ordered to verify placement for internal jugular or subclavian cannulation.   Chest x-ray is not ordered for femoral cannulation.  EBL Minimal  Specimen(s) None  Clinton November, MD  Pulmonary Critical Care 08-13-2024 6:41 PM

## 2024-07-22 NOTE — ED Notes (Signed)
 Date and time results received: Aug 03, 2024 1600  Test: Lactic Acid Critical Value: >9.0  Name of Provider Notified: MD [facility]

## 2024-07-22 NOTE — ED Notes (Signed)
 Second set of blood cultures drawn at this time by Jordan, CHARITY FUNDRAISER.

## 2024-07-23 LAB — URINE CULTURE: Culture: NO GROWTH

## 2024-07-23 LAB — TYPE AND SCREEN
ABO/RH(D): O POS
Antibody Screen: NEGATIVE
Unit division: 0
Unit division: 0
Unit division: 0

## 2024-07-23 LAB — BPAM RBC
Blood Product Expiration Date: 202601012359
Blood Product Expiration Date: 202601262359
Blood Product Expiration Date: 202601262359
Unit Type and Rh: 5100
Unit Type and Rh: 5100
Unit Type and Rh: 9500

## 2024-07-23 LAB — PREPARE FRESH FROZEN PLASMA: Unit division: 0

## 2024-07-23 LAB — PREPARE RBC (CROSSMATCH)

## 2024-07-23 LAB — BPAM FFP
Blood Product Expiration Date: 202601022359
Unit Type and Rh: 6200

## 2024-07-23 LAB — HIV ANTIBODY (ROUTINE TESTING W REFLEX): HIV Screen 4th Generation wRfx: NONREACTIVE

## 2024-07-23 LAB — URIC ACID: Uric Acid, Serum: >21 mg/dL — ABNORMAL HIGH (ref 3.7–8.6)

## 2024-07-24 LAB — GLUCOSE 6 PHOSPHATE DEHYDROGENASE
G6PDH: 8.8 U/g{Hb} (ref 4.8–15.7)
Hemoglobin: 9.1 g/dL — ABNORMAL LOW (ref 13.0–17.7)

## 2024-07-26 NOTE — Death Summary Note (Signed)
 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 year old male who was admitted to the intensive care unit (ICU) following a few days of progressive shortness of breath, fatigue, and confusion. On admission, he was found to be in respiratory distress, requiring BiPAP for respiratory support. His initial evaluation revealed profound hypotension, with a systolic blood pressure in the 60s, and severe leukocytosis (78,000 white blood cells). His laboratory results were significant for hyperkalemia, uremia, acute kidney injury, and elevated liver function tests. The patient was suspected to be suffering from septic shock likely secondary to a bacterial infection or leukemia/lymphoma, with a high suspicion for tumor lysis syndrome (TLS) or disseminated intravascular coagulation (DIC).  Despite aggressive fluid resuscitation and initiation of broad-spectrum antibiotics, his condition rapidly deteriorated. His blood pressure remained unresponsive to vasopressor therapy, and he required mechanical ventilation after failure of BiPAP support. He became increasingly hypotensive,  requiring escalating doses of norepinephrine  and vasopressin . As his condition worsened, he lost pulses, and cardiopulmonary resuscitation (CPR) was initiated. See separate code documentation  Family Notification: The patient's family was present at the bedside during the resuscitation process and was informed of the patient's critical condition. After discussions about the prognosis, the family opted for a DNR (Do Not Resuscitate) order, which was respected. They were notified of the patient's death at the time of passing.  Pronouncement of Death: The patient was pronounced dead at 08/26/2117 hours on 08-17-24.  Pertinent Labs and Studies  Significant Diagnostic Studies DG Chest Port 1 View Result Date: 08-17-24 EXAM: 1 VIEW(S) XRAY OF THE CHEST 2024/08/17 06:30:00 PM COMPARISON: August 17, 2024 03:27 PM CLINICAL HISTORY: Encounter for central line placement. FINDINGS: LINES, TUBES AND DEVICES: Interval placement of a left internal jugular central venous catheter in place with tip overlying the expected region of the distal superior vena cava. Cardiac paddles noted. LUNGS AND PLEURA: Low lung volumes. Inguinal evaluation of the left lower lung zone due to cardiac paddle overlying and low lung volumes. No focal pulmonary opacity. No pleural effusion. No pneumothorax. HEART AND MEDIASTINUM: No acute abnormality of the cardiac and mediastinal silhouettes. BONES AND SOFT TISSUES: No acute osseous abnormality. IMPRESSION: 1. Interval placement of a Left internal jugular central venous catheter with tip overlying the expected region of the distal superior vena cava. 2. Low lung volumes with limited evaluation of the left lower lung zone due to overlying cardiac paddle and low lung volumes. Electronically signed by: Morgane Naveau MD 17-Aug-2024 07:16 PM EST RP Workstation: [id number]   DG Chest Portable 1 View Result Date: 08/17/2024 CLINICAL DATA:  Shortness of breath, respiratory distress. EXAM: PORTABLE CHEST  1 VIEW COMPARISON:  None Available. FINDINGS: The heart is enlarged and the mediastinal contour is within normal limits. There is  atherosclerotic calcification of the aorta. Lung volumes are low with mild atelectasis or infiltrate at the lung bases. Interstitial prominence is present bilaterally. There suspected small bilateral pleural effusions. No pneumothorax is seen. Cardiac leads are noted. IMPRESSION: 1. Interstitial prominence bilaterally, possible edema or infiltrate. 2. Mild atelectasis or infiltrate at the lung bases. 3. Suspected small bilateral pleural effusions. Electronically Signed   By: Leita Birmingham M.D.   On: 08/05/24 17:09    Microbiology Recent Results (from the past 240 hours)  Resp panel by RT-PCR (RSV, Flu A&B, Covid) Anterior Nasal Swab     Status: None   Collection Time: 05-Aug-2024  3:19 PM   Specimen: Anterior Nasal Swab  Result Value Ref Range Status   SARS Coronavirus 2 by RT PCR NEGATIVE NEGATIVE Final    Comment: (NOTE) SARS-CoV-2 target nucleic acids are NOT DETECTED.  The SARS-CoV-2 RNA is generally detectable in upper respiratory specimens during the acute phase of infection. The lowest concentration of SARS-CoV-2 viral copies this assay can detect is 138 copies/mL. A negative result does not preclude SARS-Cov-2 infection and should not be used as the sole basis for treatment or other patient management decisions. A negative result may occur with  improper specimen collection/handling, submission of specimen other than nasopharyngeal swab, presence of viral mutation(s) within the areas targeted by this assay, and inadequate number of viral copies(<138 copies/mL). A negative result must be combined with clinical observations, patient history, and epidemiological information. The expected result is Negative.  Fact Sheet for Patients:  bloggercourse.com  Fact Sheet for Healthcare Providers:   seriousbroker.it  This test is no t yet approved or cleared by the [place]  FDA and  has been authorized for detection and/or diagnosis of SARS-CoV-2 by FDA under an Emergency Use Authorization (EUA). This EUA will remain  in effect (meaning this test can be used) for the duration of the COVID-19 declaration under Section 564(b)(1) of the Act, 21 U.S.C.section 360bbb-3(b)(1), unless the authorization is terminated  or revoked sooner.       Influenza A by PCR NEGATIVE NEGATIVE Final   Influenza B by PCR NEGATIVE NEGATIVE Final    Comment: (NOTE) The Xpert Xpress SARS-CoV-2/FLU/RSV plus assay is intended as an aid in the diagnosis of influenza from Nasopharyngeal swab specimens and should not be used as a sole basis for treatment. Nasal washings and aspirates are unacceptable for Xpert Xpress SARS-CoV-2/FLU/RSV testing.  Fact Sheet for Patients: bloggercourse.com  Fact Sheet for Healthcare Providers: seriousbroker.it  This test is not yet approved or cleared by the [place]  FDA and has been authorized for detection and/or diagnosis of SARS-CoV-2 by FDA under an Emergency Use Authorization (EUA). This EUA will remain in effect (meaning this test can be used) for the duration of the COVID-19 declaration under Section 564(b)(1) of the Act, 21 U.S.C. section 360bbb-3(b)(1), unless the authorization is terminated or revoked.     Resp Syncytial Virus by PCR NEGATIVE NEGATIVE Final    Comment: (NOTE) Fact Sheet for Patients: bloggercourse.com  Fact Sheet for Healthcare Providers: seriousbroker.it  This test is not yet approved or cleared by the [place]  FDA and has been authorized for detection and/or diagnosis of SARS-CoV-2 by FDA under an Emergency Use Authorization (EUA). This EUA will remain in effect (meaning this test can be used) for  the duration of the COVID-19 declaration under Section 564(b)(1) of the Act, 21 U.S.C. section 360bbb-3(b)(1), unless the authorization is terminated or revoked.  Performed at [facility], [address]  [address], [place], [place] [address]   MRSA Next Gen by PCR, Nasal     Status: None   Collection Time: 08-08-2024  4:37 PM   Specimen: Nasal Mucosa; Nasal Swab  Result Value Ref Range Status   MRSA by PCR Next Gen NOT DETECTED NOT DETECTED Final    Comment: (NOTE) The GeneXpert MRSA Assay (FDA approved for NASAL specimens only), is one component of a comprehensive MRSA colonization surveillance program. It is not intended to diagnose MRSA infection nor to guide or monitor treatment for MRSA infections. Test performance is not FDA approved in patients less than 94 years old. Performed at [facility], [address] [place], [place] [address]     Lab Basic Metabolic Panel: Recent Labs  Lab 08-Aug-2024 1520 08/08/24 1816  NA 141 140  K 6.4* 6.8*  CL 92* 94*  CO2 8* <7*  GLUCOSE 117* 113*  BUN 70* 72*  CREATININE 4.88* 4.79*  CALCIUM  11.5* 9.9   Liver Function Tests: Recent Labs  Lab 08/08/24 1520  AST 840*  ALT 152*  ALKPHOS 671*  BILITOT 4.1*  PROT 6.4*  ALBUMIN 3.3*   No results for input(s): LIPASE, AMYLASE in the last 168 hours. No results for input(s): AMMONIA in the last 168 hours. CBC: Recent Labs  Lab 08-Aug-2024 1520 08-Aug-2024 1816  WBC 78.5*  --   NEUTROABS  --  4.0  HGB 11.4*  --   HCT 38.5*  --   MCV 95.3  --   PLT 45*  --    Cardiac Enzymes: No results for input(s): CKTOTAL, CKMB, CKMBINDEX, TROPONINI in the last 168 hours. Sepsis Labs: Recent Labs  Lab Aug 08, 2024 1520 08/08/2024 1908  WBC 78.5*  --   LATICACIDVEN >9.0* >9.0*    Procedures/Operations  See procedure notes   Almarie DELENA Nose 08-08-2024, 10:59 PM   "

## 2024-07-26 DEATH — deceased

## 2024-07-27 LAB — CULTURE, BLOOD (ROUTINE X 2)
Culture: NO GROWTH
Culture: NO GROWTH
